# Patient Record
Sex: Female | Born: 1960 | Race: White | Hispanic: Yes | Marital: Single | Smoking: Never smoker
Health system: Southern US, Community
[De-identification: ages and names within clinical notes are randomized; demographics above are authoritative.]

## PROBLEM LIST (undated history)

## (undated) DIAGNOSIS — E7800 Pure hypercholesterolemia, unspecified: Secondary | ICD-10-CM

## (undated) DIAGNOSIS — I10 Essential (primary) hypertension: Secondary | ICD-10-CM

## (undated) DIAGNOSIS — I219 Acute myocardial infarction, unspecified: Secondary | ICD-10-CM

## (undated) HISTORY — PX: ABDOMINAL HYSTERECTOMY: SHX81

## (undated) HISTORY — PX: CARDIAC SURGERY: SHX584

---

## 2016-06-28 ENCOUNTER — Encounter (HOSPITAL_COMMUNITY): Payer: Self-pay | Admitting: Family Medicine

## 2016-06-28 ENCOUNTER — Emergency Department (HOSPITAL_COMMUNITY): Payer: Self-pay

## 2016-06-28 ENCOUNTER — Emergency Department (HOSPITAL_COMMUNITY)
Admission: EM | Admit: 2016-06-28 | Discharge: 2016-06-28 | Disposition: A | Discharge status: Home / Self Care | Payer: Self-pay | Attending: Emergency Medicine | Admitting: Emergency Medicine

## 2016-06-28 DIAGNOSIS — R42 Dizziness and giddiness: Secondary | ICD-10-CM | POA: Insufficient documentation

## 2016-06-28 DIAGNOSIS — I10 Essential (primary) hypertension: Secondary | ICD-10-CM | POA: Insufficient documentation

## 2016-06-28 HISTORY — DX: Pure hypercholesterolemia, unspecified: E78.00

## 2016-06-28 HISTORY — DX: Essential (primary) hypertension: I10

## 2016-06-28 LAB — BASIC METABOLIC PANEL
ANION GAP: 9 (ref 5–15)
BUN: 10 mg/dL (ref 6–20)
CO2: 23 mmol/L (ref 22–32)
Calcium: 8.9 mg/dL (ref 8.9–10.3)
Chloride: 107 mmol/L (ref 101–111)
Creatinine, Ser: 0.67 mg/dL (ref 0.44–1.00)
Glucose, Bld: 167 mg/dL — ABNORMAL HIGH (ref 65–99)
POTASSIUM: 3.3 mmol/L — AB (ref 3.5–5.1)
SODIUM: 139 mmol/L (ref 135–145)

## 2016-06-28 LAB — CBC WITH DIFFERENTIAL/PLATELET
BASOS ABS: 0 10*3/uL (ref 0.0–0.1)
BASOS PCT: 0 %
EOS ABS: 0 10*3/uL (ref 0.0–0.7)
EOS PCT: 0 %
HCT: 42.4 % (ref 36.0–46.0)
HEMOGLOBIN: 14.2 g/dL (ref 12.0–15.0)
Lymphocytes Relative: 18 %
Lymphs Abs: 1.8 10*3/uL (ref 0.7–4.0)
MCH: 29.6 pg (ref 26.0–34.0)
MCHC: 33.5 g/dL (ref 30.0–36.0)
MCV: 88.3 fL (ref 78.0–100.0)
Monocytes Absolute: 0.3 10*3/uL (ref 0.1–1.0)
Monocytes Relative: 4 %
NEUTROS PCT: 78 %
Neutro Abs: 7.6 10*3/uL (ref 1.7–7.7)
PLATELETS: 170 10*3/uL (ref 150–400)
RBC: 4.8 MIL/uL (ref 3.87–5.11)
RDW: 13.3 % (ref 11.5–15.5)
WBC: 9.8 10*3/uL (ref 4.0–10.5)

## 2016-06-28 MED ORDER — MECLIZINE HCL 25 MG PO TABS
25.0000 mg | ORAL_TABLET | Freq: Once | ORAL | Status: AC
  Administered 2016-06-28: 25 mg via ORAL
  Filled 2016-06-28: qty 1

## 2016-06-28 MED ORDER — DIAZEPAM 5 MG PO TABS: 5.0000 mg | ORAL_TABLET | Freq: Three times a day (TID) | ORAL | 0 refills | Status: DC | PRN

## 2016-06-28 MED ORDER — MECLIZINE HCL 25 MG PO TABS: 25.0000 mg | ORAL_TABLET | Freq: Three times a day (TID) | ORAL | 0 refills | Status: DC | PRN

## 2016-06-28 MED ORDER — METOCLOPRAMIDE HCL 5 MG/ML IJ SOLN
10.0000 mg | Freq: Once | INTRAMUSCULAR | Status: DC
  Filled 2016-06-28: qty 2

## 2016-06-28 MED ORDER — SODIUM CHLORIDE 0.9 % IV BOLUS (SEPSIS)
1000.0000 mL | Freq: Once | INTRAVENOUS | Status: AC
  Administered 2016-06-28: 1000 mL via INTRAVENOUS

## 2016-06-28 MED ORDER — MECLIZINE HCL 25 MG PO TABS
25.0000 mg | ORAL_TABLET | Freq: Once | ORAL | Status: DC
  Filled 2016-06-28: qty 1

## 2016-06-28 MED ORDER — DIAZEPAM 5 MG/ML IJ SOLN
2.5000 mg | Freq: Once | INTRAMUSCULAR | Status: AC
  Administered 2016-06-28: 2.5 mg via INTRAVENOUS
  Filled 2016-06-28: qty 2

## 2016-06-28 MED ORDER — LOSARTAN POTASSIUM-HCTZ 100-25 MG PO TABS: 1.0000 | ORAL_TABLET | Freq: Every day | ORAL | 0 refills | Status: DC

## 2016-06-28 NOTE — ED Notes (Signed)
Patient transported to MRI 

## 2016-06-28 NOTE — ED Provider Notes (Signed)
[facility] DEPT Provider Note   CSN: [id number] Arrival date & time: 06/28/16  [id number]     History   Chief Complaint Chief Complaint  Patient presents with  . Weakness  . Dizziness    HPI Crystal Lowe is a 55 y.o. female.  The history is provided by the patient and medical records. Language interpreter used: Family at bedside aiding in translation as needed.  Weakness  Primary symptoms include dizziness. Pertinent negatives include no shortness of breath, no vomiting and no headaches.  Dizziness  Associated symptoms: weakness   Associated symptoms: no headaches, no nausea, no shortness of breath and no vomiting    Crystal Lowe is a 55 y.o. female  with a PMH of HTN, HLD who presents to the Emergency Department complaining of dizziness described as the room spinning which has been occurring intermittently for the last 3 days, but much worse this morning. Dizziness is worse with lying flat and moving head left to right. No history of similar sxs. No headache. No fevers. No chest pain, shortness of breath, n/v. Took blood pressure medications and garlic this morning with no relief in symptoms.    Past Medical History:  Diagnosis Date  . High cholesterol   . Hypertension     There are no active problems to display for this patient.   History reviewed. No pertinent surgical history.  OB History    No data available       Home Medications    Prior to Admission medications   Medication Sig Start Date End Date Taking? Authorizing Provider  atorvastatin (LIPITOR) 20 MG tablet Take 20 mg by mouth daily.   Yes Historical Provider, MD  clopidogrel (PLAVIX) 75 MG tablet Take 75 mg by mouth daily.   Yes Historical Provider, MD  gabapentin (NEURONTIN) 100 MG capsule Take 100 mg by mouth daily.   Yes Historical Provider, MD  GARLIC PO Take 1 capsule by mouth daily.   Yes Historical Provider, MD  losartan-hydrochlorothiazide (HYZAAR) 100-25 MG tablet Take 1  tablet by mouth daily.   Yes Historical Provider, MD  metoprolol succinate (TOPROL-XL) 50 MG 24 hr tablet Take 100 mg by mouth daily. Take with or immediately following a meal.   Yes Historical Provider, MD  diazepam (VALIUM) 5 MG tablet Take 1 tablet (5 mg total) by mouth every 8 (eight) hours as needed for anxiety. 06/28/16   Andreal Vultaggio Pilcher Curties Conigliaro, PA-C  meclizine (ANTIVERT) 25 MG tablet Take 1 tablet (25 mg total) by mouth 3 (three) times daily as needed for dizziness. 06/28/16   Wilmore, PA-C    Family History History reviewed. No pertinent family history.  Social History Social History  Substance Use Topics  . Smoking status: Never Smoker  . Smokeless tobacco: Never Used  . Alcohol use Not on file     Allergies   Aspirin   Review of Systems Review of Systems  Constitutional: Negative for chills and fever.  HENT: Negative for ear pain.   Eyes: Negative for visual disturbance.  Respiratory: Negative for cough and shortness of breath.   Cardiovascular: Negative.   Gastrointestinal: Negative for abdominal pain, nausea and vomiting.  Genitourinary: Negative for dysuria.  Musculoskeletal: Negative for back pain.  Skin: Negative for rash.  Neurological: Positive for dizziness and weakness. Negative for light-headedness, numbness and headaches.     Physical Exam Updated Vital Signs BP 146/87 (BP Location: Left Arm)   Pulse 66   Temp 97.4 F (36.3 C) (Oral)  Resp 16   SpO2 100%   Physical Exam  Constitutional: She is oriented to person, place, and time. She appears well-developed and well-nourished. No distress.  HENT:  Head: Normocephalic and atraumatic.  Eyes:  Horizontal nystagmus   Neck: Normal range of motion.  Cardiovascular: Normal rate, regular rhythm and normal heart sounds.   No murmur heard. Pulmonary/Chest: Effort normal and breath sounds normal. No respiratory distress. She has no wheezes. She has no rales.  Abdominal: Soft. She exhibits no  distension. There is no tenderness.  Musculoskeletal: She exhibits no edema.  5/5 muscle strength in all four extremities including grip strength.   Neurological: She is alert and oriented to person, place, and time.  Alert, oriented, thought content appropriate, able to give a coherent history. Speech is clear and goal oriented, able to follow commands.  Cranial Nerves:  II:  Peripheral visual fields grossly normal, pupils equal, round, reactive to light III, IV, VI: EOM intact bilaterally, ptosis not present V,VII: smile symmetric, eyes kept closed tightly against resistance, facial light touch sensation equal VIII: hearing grossly normal IX, X: symmetric soft palate movement, uvula elevates symmetrically  XI: bilateral shoulder shrug symmetric and strong XII: midline tongue extension Sensory to light touch normal in all four extremities.   Skin: Skin is warm and dry.  Nursing note and vitals reviewed.    ED Treatments / Results  Labs (all labs ordered are listed, but only abnormal results are displayed) Labs Reviewed  BASIC METABOLIC PANEL - Abnormal; Notable for the following:       Result Value   Potassium 3.3 (*)    Glucose, Bld 167 (*)    All other components within normal limits  CBC WITH DIFFERENTIAL/PLATELET    EKG  EKG Interpretation None       Radiology Mr Brain Wo Contrast  Result Date: 06/28/2016 CLINICAL DATA:  Weakness and dizziness for a few days. Recent travel outside the country to [place]. EXAM: MRI HEAD WITHOUT CONTRAST TECHNIQUE: Multiplanar, multiecho pulse sequences of the brain and surrounding structures were obtained without intravenous contrast. COMPARISON:  None. FINDINGS: Brain: No evidence for acute infarction, hemorrhage, mass lesion, hydrocephalus, or extra-axial fluid. Normal cerebral volume. No significant white matter disease. Vascular: Flow voids are maintained throughout the carotid, basilar, and vertebral arteries. There are no areas  of chronic hemorrhage. Skull and upper cervical spine: Empty sella with expansion. Upper cervical region unremarkable. No tonsillar herniation. Normal marrow signal. Sinuses/Orbits: Negative. Other: Prominent cervical lymph nodes, incompletely evaluated. IMPRESSION: No acute intracranial findings. Empty sella, which can be a cause of headaches and other nonspecific neurologic complaints. Relationship of this finding to the patient's dizziness is uncertain. Electronically Signed   By: Staci Righter M.D.   On: 06/28/2016 14:36    Procedures Procedures (including critical care time)  Medications Ordered in ED Medications  sodium chloride 0.9 % bolus 1,000 mL (1,000 mLs Intravenous New Bag/Given 06/28/16 1021)  meclizine (ANTIVERT) tablet 25 mg (25 mg Oral Given 06/28/16 1021)  diazepam (VALIUM) injection 2.5 mg (2.5 mg Intravenous Given 06/28/16 1203)  diazepam (VALIUM) injection 2.5 mg (2.5 mg Intravenous Given 06/28/16 1328)     Initial Impression / Assessment and Plan / ED Course  I have reviewed the triage vital signs and the nursing notes.  Pertinent labs & imaging results that were available during my care of the patient were reviewed by me and considered in my medical decision making (see chart for details).  Clinical Course  Crystal Lowe is a 55 y.o. female who presents to ED for intermittent dizziness for the last 3 days. No headache. On exam, patient is afebrile, EOMI but with horizontal nystagmus present. Dizziness is reproducible with rotation of the head and lying flat. Appears c/w peripheral vertigo. Will treat with meclizine, fluids and obtain labs then reassess.   11:21 AM - Patient re-evaluated. Dizziness improved, but not resolved. She would like to use the restroom. I assisted patient up to evaluate gait to restroom, but unfortunately she became very dizzy. Wheelchair used to assist patient to restroom.   12:30 PM - Patient re-evaluated after valium. Did have some  improvement, but still very dizzy with slight movement of the head. Will obtain MR for further evaluation.    MR with no acute intracranial findings. Patient re-evaluated and feels much improved. Still complaining of some dizziness but feels like she will be okay going home. Discussed admission vs. Home and patient would like to go home today. Will give rx for antivert and valium for symptoms. Follow up with neurology if symptoms not improved on Monday. Reasons to return to the ER were discussed and all questions answered.   Patient seen by and discussed with Dr. Johnney Killian who agrees with treatment plan.    Final Clinical Impressions(s) / ED Diagnoses   Final diagnoses:  Vertigo    New Prescriptions New Prescriptions   DIAZEPAM (VALIUM) 5 MG TABLET    Take 1 tablet (5 mg total) by mouth every 8 (eight) hours as needed for anxiety.   MECLIZINE (ANTIVERT) 25 MG TABLET    Take 1 tablet (25 mg total) by mouth 3 (three) times daily as needed for dizziness.     [facility] Harel Repetto, PA-C 06/28/16 1511    Charlesetta Shanks, MD 06/29/16 [phone]

## 2016-06-28 NOTE — ED Triage Notes (Addendum)
Pt here for weakness and dizziness x a few days. hasn't had her meds in a few days for BP. Recent travel from [place]. Pt on HCTZ and metoprolol. Pt took 2 of a BP med this am unsure of which one. Stroke scale negative. No headaches. 20 RAC and Zofran given with improvement. BP was 147/80, pulse 60

## 2016-06-28 NOTE — Discharge Instructions (Signed)
Take meclizine and valium as needed for dizziness. Stay hydrated.  Please call the neurology clinic on Monday morning to schedule a follow up appointment if symptoms are not completely resolved. Please return to the ER for uncontrollable vomiting, new or worsening symptoms, any additional concerns.

## 2016-08-07 ENCOUNTER — Emergency Department (HOSPITAL_COMMUNITY)
Admission: EM | Admit: 2016-08-07 | Discharge: 2016-08-07 | Disposition: A | Discharge status: Home / Self Care | Payer: Self-pay | Attending: Emergency Medicine | Admitting: Emergency Medicine

## 2016-08-07 ENCOUNTER — Encounter (HOSPITAL_COMMUNITY): Payer: Self-pay

## 2016-08-07 DIAGNOSIS — Z7902 Long term (current) use of antithrombotics/antiplatelets: Secondary | ICD-10-CM | POA: Insufficient documentation

## 2016-08-07 DIAGNOSIS — Z79899 Other long term (current) drug therapy: Secondary | ICD-10-CM | POA: Insufficient documentation

## 2016-08-07 DIAGNOSIS — H8112 Benign paroxysmal vertigo, left ear: Secondary | ICD-10-CM | POA: Insufficient documentation

## 2016-08-07 DIAGNOSIS — I10 Essential (primary) hypertension: Secondary | ICD-10-CM | POA: Insufficient documentation

## 2016-08-07 DIAGNOSIS — I252 Old myocardial infarction: Secondary | ICD-10-CM | POA: Insufficient documentation

## 2016-08-07 HISTORY — DX: Acute myocardial infarction, unspecified: I21.9

## 2016-08-07 LAB — BASIC METABOLIC PANEL
ANION GAP: 10 (ref 5–15)
BUN: 18 mg/dL (ref 6–20)
CALCIUM: 9.7 mg/dL (ref 8.9–10.3)
CO2: 28 mmol/L (ref 22–32)
CREATININE: 0.79 mg/dL (ref 0.44–1.00)
Chloride: 101 mmol/L (ref 101–111)
GFR calc Af Amer: >60 mL/min (ref >60)
GFR calc non Af Amer: >60 mL/min (ref >60)
GLUCOSE: 112 mg/dL — AB (ref 65–99)
Potassium: 3.3 mmol/L — ABNORMAL LOW (ref 3.5–5.1)
Sodium: 139 mmol/L (ref 135–145)

## 2016-08-07 LAB — CBC
HCT: 42.8 % (ref 36.0–46.0)
HEMOGLOBIN: 14.5 g/dL (ref 12.0–15.0)
MCH: 29.7 pg (ref 26.0–34.0)
MCHC: 33.9 g/dL (ref 30.0–36.0)
MCV: 87.5 fL (ref 78.0–100.0)
Platelets: 204 10*3/uL (ref 150–400)
RBC: 4.89 MIL/uL (ref 3.87–5.11)
RDW: 13.4 % (ref 11.5–15.5)
WBC: 9.8 10*3/uL (ref 4.0–10.5)

## 2016-08-07 LAB — URINALYSIS, ROUTINE W REFLEX MICROSCOPIC
Bacteria, UA: NONE SEEN
Bilirubin Urine: NEGATIVE
GLUCOSE, UA: NEGATIVE mg/dL
Hgb urine dipstick: NEGATIVE
Ketones, ur: NEGATIVE mg/dL
Nitrite: NEGATIVE
PROTEIN: NEGATIVE mg/dL
Specific Gravity, Urine: 1.016 (ref 1.005–1.030)
pH: 6 (ref 5.0–8.0)

## 2016-08-07 LAB — CBG MONITORING, ED: Glucose-Capillary: 97 mg/dL (ref 65–99)

## 2016-08-07 MED ORDER — MECLIZINE HCL 25 MG PO TABS: 25.0000 mg | ORAL_TABLET | Freq: Three times a day (TID) | ORAL | 0 refills | Status: DC | PRN

## 2016-08-07 NOTE — ED Provider Notes (Signed)
[facility] DEPT Provider Note   CSN: [id number] Arrival date & time: 08/07/16  1400     History   Chief Complaint Chief Complaint  Patient presents with  . Hypertension  . Nausea  . Near Syncope    HPI Crystal Lowe is a 56 y.o. female.  The history is provided by the patient.  Dizziness  Quality:  Room spinning Severity:  Moderate Onset quality:  Sudden Duration:  6 hours Timing:  Constant Progression:  Unchanged Chronicity:  Recurrent Context: head movement   Context: not with loss of consciousness   Relieved by:  Nothing Worsened by:  Nothing Ineffective treatments: single dose of meclizine at home. Associated symptoms: vomiting (once)   Associated symptoms: no shortness of breath and no tinnitus   Risk factors: hx of vertigo     Past Medical History:  Diagnosis Date  . High cholesterol   . Hypertension   . MI (myocardial infarction)     There are no active problems to display for this patient.   Past Surgical History:  Procedure Laterality Date  . ABDOMINAL HYSTERECTOMY    . CARDIAC SURGERY      OB History    No data available       Home Medications    Prior to Admission medications   Medication Sig Start Date End Date Taking? Authorizing Provider  atorvastatin (LIPITOR) 20 MG tablet Take 20 mg by mouth daily.    Historical Provider, MD  clopidogrel (PLAVIX) 75 MG tablet Take 75 mg by mouth daily.    Historical Provider, MD  diazepam (VALIUM) 5 MG tablet Take 1 tablet (5 mg total) by mouth every 8 (eight) hours as needed for anxiety. 06/28/16   Ozella Almond Ward, PA-C  gabapentin (NEURONTIN) 100 MG capsule Take 100 mg by mouth daily.    Historical Provider, MD  GARLIC PO Take 1 capsule by mouth daily.    Historical Provider, MD  losartan-hydrochlorothiazide (HYZAAR) 100-25 MG tablet Take 1 tablet by mouth daily. 06/28/16   Jaime Pilcher Ward, PA-C  meclizine (ANTIVERT) 25 MG tablet Take 1 tablet (25 mg total) by mouth 3 (three)  times daily as needed for dizziness. 06/28/16   Ozella Almond Ward, PA-C  metoprolol succinate (TOPROL-XL) 50 MG 24 hr tablet Take 100 mg by mouth daily. Take with or immediately following a meal.    Historical Provider, MD    Family History Family History  Problem Relation Age of Onset  . Hypertension Mother   . Hypertension Father   . Diabetes Father   . Asthma Father   . Kidney failure Father     Social History Social History  Substance Use Topics  . Smoking status: Never Smoker  . Smokeless tobacco: Never Used  . Alcohol use No     Allergies   Aspirin   Review of Systems Review of Systems  HENT: Negative for tinnitus.   Respiratory: Negative for shortness of breath.   Gastrointestinal: Positive for vomiting (once).  Neurological: Positive for dizziness.  All other systems reviewed and are negative.    Physical Exam Updated Vital Signs BP 135/86 (BP Location: Left Arm)   Pulse 66   Temp 98 F (36.7 C) (Oral)   Resp 20   Ht 5\' 6"  (1.676 m)   Wt 231 lb (104.8 kg)   SpO2 100%   BMI 37.28 kg/m   Physical Exam  Constitutional: She is oriented to person, place, and time. She appears well-developed and well-nourished. No distress.  HENT:  Head: Normocephalic.  Nose: Nose normal.  Eyes: Conjunctivae are normal.  Neck: Neck supple. No tracheal deviation present.  Cardiovascular: Normal rate and regular rhythm.   Pulmonary/Chest: Effort normal. No respiratory distress.  Abdominal: Soft. She exhibits no distension.  Neurological: She is alert and oriented to person, place, and time. She has normal strength. No cranial nerve deficit or sensory deficit. GCS eye subscore is 4. GCS verbal subscore is 5. GCS motor subscore is 6.  Normal finger to nose testing and rapid alternating movement. Positive left head impulse with nystagmus present. Ambulatory unassisted with normal gait.  Skin: Skin is warm and dry.  Psychiatric: She has a normal mood and affect.     ED  Treatments / Results  Labs (all labs ordered are listed, but only abnormal results are displayed) Labs Reviewed  BASIC METABOLIC PANEL - Abnormal; Notable for the following:       Result Value   Potassium 3.3 (*)    Glucose, Bld 112 (*)    All other components within normal limits  URINALYSIS, ROUTINE W REFLEX MICROSCOPIC - Abnormal; Notable for the following:    Leukocytes, UA TRACE (*)    Squamous Epithelial / LPF 0-5 (*)    All other components within normal limits  CBC  CBG MONITORING, ED    EKG  EKG Interpretation None       Radiology No results found.  Procedures Procedures (including critical care time)  Medications Ordered in ED Medications - No data to display   Initial Impression / Assessment and Plan / ED Course  I have reviewed the triage vital signs and the nursing notes.  Pertinent labs & imaging results that were available during my care of the patient were reviewed by me and considered in my medical decision making (see chart for details).  Clinical Course     56 y.o. female presents with dizziness starting today. She is out of her meclizine prescription as of this morning. She has a positive left impulse test and convincing peripheral vertigo. No central component on exam. Complaint of hypertension at home that has resolved since taking home medicines. Refilled meclizine prescription for symptomatic treatment. Plan to follow up with PCP as needed and return precautions discussed for worsening or new concerning symptoms.   Final Clinical Impressions(s) / ED Diagnoses   Final diagnoses:  BPPV (benign paroxysmal positional vertigo), left    New Prescriptions Discharge Medication List as of 08/07/2016  8:22 PM       Leo Grosser, MD 08/08/16 [id number]

## 2016-08-07 NOTE — ED Triage Notes (Signed)
Patient states she had vomiting and dizziness and her  BP was 145/100.

## 2018-01-25 ENCOUNTER — Telehealth: Payer: Self-pay | Admitting: *Deleted

## 2018-01-25 NOTE — Telephone Encounter (Signed)
REFERRAL SENT TO [facility] FROM [place], NP [phone].

## 2018-01-27 ENCOUNTER — Observation Stay (HOSPITAL_COMMUNITY)
Admission: EM | Admit: 2018-01-27 | Discharge: 2018-01-29 | Disposition: A | Discharge status: Home / Self Care | Payer: Medicaid Other | Attending: Family Medicine | Admitting: Family Medicine

## 2018-01-27 ENCOUNTER — Encounter (HOSPITAL_COMMUNITY): Payer: Self-pay | Admitting: *Deleted

## 2018-01-27 ENCOUNTER — Other Ambulatory Visit: Payer: Self-pay

## 2018-01-27 ENCOUNTER — Emergency Department (HOSPITAL_COMMUNITY): Payer: Medicaid Other

## 2018-01-27 DIAGNOSIS — I252 Old myocardial infarction: Secondary | ICD-10-CM | POA: Insufficient documentation

## 2018-01-27 DIAGNOSIS — I110 Hypertensive heart disease with heart failure: Secondary | ICD-10-CM | POA: Diagnosis not present

## 2018-01-27 DIAGNOSIS — I420 Dilated cardiomyopathy: Secondary | ICD-10-CM | POA: Diagnosis not present

## 2018-01-27 DIAGNOSIS — Z79899 Other long term (current) drug therapy: Secondary | ICD-10-CM | POA: Diagnosis not present

## 2018-01-27 DIAGNOSIS — E876 Hypokalemia: Secondary | ICD-10-CM

## 2018-01-27 DIAGNOSIS — R0789 Other chest pain: Principal | ICD-10-CM | POA: Insufficient documentation

## 2018-01-27 DIAGNOSIS — I2510 Atherosclerotic heart disease of native coronary artery without angina pectoris: Secondary | ICD-10-CM | POA: Diagnosis not present

## 2018-01-27 DIAGNOSIS — Z9889 Other specified postprocedural states: Secondary | ICD-10-CM | POA: Diagnosis not present

## 2018-01-27 DIAGNOSIS — E875 Hyperkalemia: Secondary | ICD-10-CM | POA: Diagnosis not present

## 2018-01-27 DIAGNOSIS — R079 Chest pain, unspecified: Secondary | ICD-10-CM

## 2018-01-27 DIAGNOSIS — E7800 Pure hypercholesterolemia, unspecified: Secondary | ICD-10-CM | POA: Diagnosis not present

## 2018-01-27 DIAGNOSIS — R112 Nausea with vomiting, unspecified: Secondary | ICD-10-CM | POA: Insufficient documentation

## 2018-01-27 DIAGNOSIS — Z886 Allergy status to analgesic agent status: Secondary | ICD-10-CM | POA: Insufficient documentation

## 2018-01-27 DIAGNOSIS — Z90710 Acquired absence of both cervix and uterus: Secondary | ICD-10-CM | POA: Insufficient documentation

## 2018-01-27 DIAGNOSIS — I5030 Unspecified diastolic (congestive) heart failure: Secondary | ICD-10-CM | POA: Insufficient documentation

## 2018-01-27 DIAGNOSIS — Z8249 Family history of ischemic heart disease and other diseases of the circulatory system: Secondary | ICD-10-CM | POA: Insufficient documentation

## 2018-01-27 DIAGNOSIS — I10 Essential (primary) hypertension: Secondary | ICD-10-CM | POA: Diagnosis present

## 2018-01-27 LAB — CBC
HCT: 44.6 % (ref 36.0–46.0)
Hemoglobin: 14.7 g/dL (ref 12.0–15.0)
MCH: 29.5 pg (ref 26.0–34.0)
MCHC: 33 g/dL (ref 30.0–36.0)
MCV: 89.6 fL (ref 78.0–100.0)
PLATELETS: 207 10*3/uL (ref 150–400)
RBC: 4.98 MIL/uL (ref 3.87–5.11)
RDW: 12.8 % (ref 11.5–15.5)
WBC: 10.9 10*3/uL — ABNORMAL HIGH (ref 4.0–10.5)

## 2018-01-27 LAB — BASIC METABOLIC PANEL
Anion gap: 12 (ref 5–15)
BUN: 16 mg/dL (ref 6–20)
CHLORIDE: 98 mmol/L (ref 98–111)
CO2: 25 mmol/L (ref 22–32)
CREATININE: 0.87 mg/dL (ref 0.44–1.00)
Calcium: 9.4 mg/dL (ref 8.9–10.3)
GFR calc Af Amer: >60 mL/min (ref >60)
GFR calc non Af Amer: >60 mL/min (ref >60)
Glucose, Bld: 141 mg/dL — ABNORMAL HIGH (ref 70–99)
Potassium: 2.4 mmol/L — CL (ref 3.5–5.1)
SODIUM: 135 mmol/L (ref 135–145)

## 2018-01-27 LAB — I-STAT TROPONIN, ED: Troponin i, poc: 0 ng/mL (ref 0.00–0.08)

## 2018-01-27 MED ORDER — POTASSIUM CHLORIDE CRYS ER 20 MEQ PO TBCR
40.0000 meq | EXTENDED_RELEASE_TABLET | Freq: Once | ORAL | Status: AC
  Administered 2018-01-27: 40 meq via ORAL
  Filled 2018-01-27: qty 2

## 2018-01-27 MED ORDER — POTASSIUM CHLORIDE 10 MEQ/100ML IV SOLN
10.0000 meq | Freq: Once | INTRAVENOUS | Status: AC
  Administered 2018-01-27: 10 meq via INTRAVENOUS
  Filled 2018-01-27: qty 100

## 2018-01-27 MED ORDER — MAGNESIUM SULFATE 2 GM/50ML IV SOLN
2.0000 g | Freq: Once | INTRAVENOUS | Status: AC
  Administered 2018-01-28: 2 g via INTRAVENOUS
  Filled 2018-01-27: qty 50

## 2018-01-27 MED ORDER — FENTANYL CITRATE (PF) 100 MCG/2ML IJ SOLN
50.0000 ug | Freq: Once | INTRAMUSCULAR | Status: AC
  Administered 2018-01-27: 50 ug via INTRAVENOUS
  Filled 2018-01-27: qty 2

## 2018-01-27 NOTE — ED Notes (Signed)
Pt reports having chest pressure and dizziness when she was bending over looking into her oven. Pt reports a hx of MI in the past.

## 2018-01-27 NOTE — ED Triage Notes (Signed)
Pt arrived by EMS for chest pain and dizziness. Pt was looking inside her oven, stood up, started feeling chest pressure and dizziness with NV x 4. Pt has allergy to ASA. Pt bp 106/64, no nitro was given. [id number]. Hx of MI several years ago

## 2018-01-27 NOTE — ED Provider Notes (Signed)
[facility] EMERGENCY DEPARTMENT Provider Note   CSN: [id number] Arrival date & time: 01/27/18  2157     History   Chief Complaint Chief Complaint  Patient presents with  . Chest Pain  . Dizziness    HPI Crystal Lowe is a 57 y.o. female.  The history is provided by the patient and a relative.  Chest Pain   This is a new problem. The current episode started 3 to 5 hours ago. The problem occurs constantly. The problem has not changed since onset.The pain is present in the substernal region. The pain is moderate. The quality of the pain is described as pressure-like ("Trapped air "). The pain does not radiate. Associated symptoms include diaphoresis, dizziness and vomiting. Pertinent negatives include no abdominal pain, no fever, no shortness of breath and no syncope. She has tried nothing for the symptoms.  Her past medical history is significant for CAD.  Patient with history of hyperlipidemia, hypertension, previous MI presents with chest pain.  She reports she was bending over to open up an oven and she started having chest pressure and felt like air was trapped in her chest.  She also reports dizziness.  She reports she had diaphoresis, vomiting, 2 episodes of diarrhea  She reports she had an MI several years ago while living in [place]  Past Medical History:  Diagnosis Date  . High cholesterol   . Hypertension   . MI (myocardial infarction) ([place])     There are no active problems to display for this patient.   Past Surgical History:  Procedure Laterality Date  . ABDOMINAL HYSTERECTOMY    . CARDIAC SURGERY       OB History   None      Home Medications    Prior to Admission medications   Medication Sig Start Date End Date Taking? Authorizing Provider  atorvastatin (LIPITOR) 20 MG tablet Take 20 mg by mouth daily.    [provider]  clopidogrel (PLAVIX) 75 MG tablet Take 75 mg by mouth daily.    [provider]    diazepam (VALIUM) 5 MG tablet Take 1 tablet (5 mg total) by mouth every 8 (eight) hours as needed for anxiety. 06/28/16   Ward, Ozella Almond, PA-C  gabapentin (NEURONTIN) 100 MG capsule Take 100 mg by mouth daily.    [provider]  GARLIC PO Take 1 capsule by mouth daily.    [provider]  losartan-hydrochlorothiazide (HYZAAR) 100-25 MG tablet Take 1 tablet by mouth daily. 06/28/16   Ward, Ozella Almond, PA-C  meclizine (ANTIVERT) 25 MG tablet Take 1 tablet (25 mg total) by mouth 3 (three) times daily as needed for dizziness. 08/07/16   Leo Grosser, MD  metoprolol succinate (TOPROL-XL) 50 MG 24 hr tablet Take 100 mg by mouth daily. Take with or immediately following a meal.    [provider]    Family History Family History  Problem Relation Age of Onset  . Hypertension Mother   . Hypertension Father   . Diabetes Father   . Asthma Father   . Kidney failure Father     Social History Social History   Tobacco Use  . Smoking status: Never Smoker  . Smokeless tobacco: Never Used  Substance Use Topics  . Alcohol use: No  . Drug use: No     Allergies   Aspirin   Review of Systems Review of Systems  Constitutional: Positive for diaphoresis. Negative for fever.  Respiratory: Negative  for shortness of breath.   Cardiovascular: Positive for chest pain. Negative for syncope.  Gastrointestinal: Positive for diarrhea and vomiting. Negative for abdominal pain.  Neurological: Positive for dizziness.  All other systems reviewed and are negative.    Physical Exam Updated Vital Signs BP 117/72   Pulse 62   Temp 97.9 F (36.6 C)   Resp 12   SpO2 97%   Physical Exam CONSTITUTIONAL: Well developed/well nourished HEAD: Normocephalic/atraumatic EYES: EOMI/PERRL ENMT: Mucous membranes moist NECK: supple no meningeal signs SPINE/BACK:entire spine nontender CV: S1/S2 noted, no murmurs/rubs/gallops noted Chest-mild left-sided chest  tenderness LUNGS: Lungs are clear to auscultation bilaterally, no apparent distress ABDOMEN: soft, nontender, no rebound or guarding, bowel sounds noted throughout abdomen GU:no cva tenderness NEURO: Pt is awake/alert/appropriate, moves all extremitiesx4.  No facial droop. no Focal weakness noted EXTREMITIES: pulses normal/equal, full ROM SKIN: warm, color normal PSYCH: no abnormalities of mood noted, alert and oriented to situation   ED Treatments / Results  Labs (all labs ordered are listed, but only abnormal results are displayed) Labs Reviewed  BASIC METABOLIC PANEL - Abnormal; Notable for the following components:      Result Value   Potassium 2.4 (*)    Glucose, Bld 141 (*)    All other components within normal limits  CBC - Abnormal; Notable for the following components:   WBC 10.9 (*)    All other components within normal limits  I-STAT TROPONIN, ED    EKG EKG Interpretation  Date/Time:  Wednesday January 27 2018 22:02:47 EDT Ventricular Rate:  64 PR Interval:    QRS Duration: 95 QT Interval:  447 QTC Calculation: 462 R Axis:   36 Text Interpretation:  Sinus rhythm Confirmed by Ripley Fraise [phone]) on 01/27/2018 11:10:04 PM   Radiology Dg Chest 2 View  Result Date: 01/27/2018 CLINICAL DATA:  Chest pain and dizziness.  Nausea and vomiting. EXAM: CHEST - 2 VIEW COMPARISON:  None. FINDINGS: The heart size and mediastinal contours are within normal limits. Both lungs are clear. The visualized skeletal structures are unremarkable. IMPRESSION: No active cardiopulmonary disease. Electronically Signed   By: Lucienne Capers M.D.   On: 01/27/2018 23:07    Procedures Procedures   Medications Ordered in ED Medications  fentaNYL (SUBLIMAZE) injection 50 mcg (has no administration in time range)  potassium chloride 10 mEq in 100 mL IVPB (has no administration in time range)  magnesium sulfate IVPB 2 g 50 mL (has no administration in time range)  potassium chloride SA  (K-DUR,KLOR-CON) CR tablet 40 mEq (40 mEq Oral Given 01/27/18 2348)     Initial Impression / Assessment and Plan / ED Course  I have reviewed the triage vital signs and the nursing notes.  Pertinent labs & imaging results that were available during my care of the patient were reviewed by me and considered in my medical decision making (see chart for details).     11:56 PM Patient with known history of CAD presents with chest pain.  No recent work-up noted in our system.  Will admit for chest pain work-up.  She is also noted to have significant hypokalemia, likely due to medications that she is on hydrochlorthiazide. 1:13 AM D/w Dr. Hal Hope for admission.  Patient is feeling improved.  Final Clinical Impressions(s) / ED Diagnoses   Final diagnoses:  Chest pain, rule out acute myocardial infarction  Hypokalemia    ED Discharge Orders    None       Ripley Fraise, MD 01/28/18 [phone]

## 2018-01-28 ENCOUNTER — Encounter (HOSPITAL_COMMUNITY): Payer: Self-pay | Admitting: Internal Medicine

## 2018-01-28 ENCOUNTER — Observation Stay (HOSPITAL_BASED_OUTPATIENT_CLINIC_OR_DEPARTMENT_OTHER): Payer: Medicaid Other

## 2018-01-28 ENCOUNTER — Other Ambulatory Visit: Payer: Self-pay

## 2018-01-28 DIAGNOSIS — E785 Hyperlipidemia, unspecified: Secondary | ICD-10-CM | POA: Diagnosis not present

## 2018-01-28 DIAGNOSIS — E876 Hypokalemia: Secondary | ICD-10-CM | POA: Diagnosis not present

## 2018-01-28 DIAGNOSIS — R0789 Other chest pain: Secondary | ICD-10-CM | POA: Diagnosis not present

## 2018-01-28 DIAGNOSIS — R079 Chest pain, unspecified: Secondary | ICD-10-CM | POA: Diagnosis not present

## 2018-01-28 DIAGNOSIS — I10 Essential (primary) hypertension: Secondary | ICD-10-CM | POA: Diagnosis not present

## 2018-01-28 DIAGNOSIS — I361 Nonrheumatic tricuspid (valve) insufficiency: Secondary | ICD-10-CM

## 2018-01-28 LAB — HEPATIC FUNCTION PANEL
ALT: 29 U/L (ref 0–44)
AST: 29 U/L (ref 15–41)
Albumin: 3.8 g/dL (ref 3.5–5.0)
Alkaline Phosphatase: 47 U/L (ref 38–126)
BILIRUBIN DIRECT: 0.1 mg/dL (ref 0.0–0.2)
BILIRUBIN INDIRECT: 0.7 mg/dL (ref 0.3–0.9)
BILIRUBIN TOTAL: 0.8 mg/dL (ref 0.3–1.2)
Total Protein: 7.2 g/dL (ref 6.5–8.1)

## 2018-01-28 LAB — ECHOCARDIOGRAM COMPLETE
HEIGHTINCHES: 66 in
WEIGHTICAEL: 3756.8 [oz_av]

## 2018-01-28 LAB — POTASSIUM: Potassium: 3.3 mmol/L — ABNORMAL LOW (ref 3.5–5.1)

## 2018-01-28 LAB — LIPID PANEL
Cholesterol: 186 mg/dL (ref 0–200)
HDL: 38 mg/dL — ABNORMAL LOW (ref >40)
LDL CALC: 98 mg/dL (ref 0–99)
Total CHOL/HDL Ratio: 4.9 RATIO
Triglycerides: 251 mg/dL — ABNORMAL HIGH (ref <150)
VLDL: 50 mg/dL — ABNORMAL HIGH (ref 0–40)

## 2018-01-28 LAB — TROPONIN I
Troponin I: <0.03 ng/mL (ref <0.03)
Troponin I: <0.03 ng/mL (ref <0.03)

## 2018-01-28 LAB — MAGNESIUM: MAGNESIUM: 2 mg/dL (ref 1.7–2.4)

## 2018-01-28 MED ORDER — ACETAMINOPHEN 325 MG PO TABS: 650.0000 mg | ORAL_TABLET | ORAL | Status: DC | PRN

## 2018-01-28 MED ORDER — FAMOTIDINE 20 MG PO TABS
20.0000 mg | ORAL_TABLET | Freq: Two times a day (BID) | ORAL | Status: DC
  Administered 2018-01-28 – 2018-01-29 (×3): 20 mg via ORAL
  Filled 2018-01-28 (×3): qty 1

## 2018-01-28 MED ORDER — AMLODIPINE BESYLATE 5 MG PO TABS: 5.0000 mg | ORAL_TABLET | Freq: Every day | ORAL | Status: DC | PRN

## 2018-01-28 MED ORDER — CLOPIDOGREL BISULFATE 75 MG PO TABS
75.0000 mg | ORAL_TABLET | Freq: Every day | ORAL | Status: DC
  Administered 2018-01-28 – 2018-01-29 (×2): 75 mg via ORAL
  Filled 2018-01-28 (×2): qty 1

## 2018-01-28 MED ORDER — MECLIZINE HCL 25 MG PO TABS: 25.0000 mg | ORAL_TABLET | Freq: Three times a day (TID) | ORAL | Status: DC | PRN

## 2018-01-28 MED ORDER — ONDANSETRON HCL 4 MG/2ML IJ SOLN: 4.0000 mg | Freq: Four times a day (QID) | INTRAMUSCULAR | Status: DC | PRN

## 2018-01-28 MED ORDER — POTASSIUM CHLORIDE CRYS ER 20 MEQ PO TBCR
40.0000 meq | EXTENDED_RELEASE_TABLET | Freq: Once | ORAL | Status: AC
  Administered 2018-01-28: 40 meq via ORAL
  Filled 2018-01-28: qty 2

## 2018-01-28 MED ORDER — GABAPENTIN 400 MG PO CAPS
400.0000 mg | ORAL_CAPSULE | Freq: Every day | ORAL | Status: DC
  Administered 2018-01-28: 400 mg via ORAL
  Filled 2018-01-28: qty 1

## 2018-01-28 MED ORDER — DIAZEPAM 5 MG PO TABS: 5.0000 mg | ORAL_TABLET | Freq: Three times a day (TID) | ORAL | Status: DC | PRN

## 2018-01-28 MED ORDER — TRAZODONE HCL 50 MG PO TABS
50.0000 mg | ORAL_TABLET | Freq: Every day | ORAL | Status: DC
  Administered 2018-01-28: 50 mg via ORAL
  Filled 2018-01-28: qty 1

## 2018-01-28 MED ORDER — METOPROLOL SUCCINATE ER 100 MG PO TB24
100.0000 mg | ORAL_TABLET | Freq: Every day | ORAL | Status: DC
  Administered 2018-01-28 – 2018-01-29 (×2): 100 mg via ORAL
  Filled 2018-01-28 (×2): qty 1

## 2018-01-28 MED ORDER — ENOXAPARIN SODIUM 40 MG/0.4ML ~~LOC~~ SOLN
40.0000 mg | SUBCUTANEOUS | Status: DC
  Administered 2018-01-28 – 2018-01-29 (×2): 40 mg via SUBCUTANEOUS
  Filled 2018-01-28 (×2): qty 0.4

## 2018-01-28 MED ORDER — ALUM & MAG HYDROXIDE-SIMETH 200-200-20 MG/5ML PO SUSP
30.0000 mL | Freq: Four times a day (QID) | ORAL | Status: DC | PRN
  Administered 2018-01-28: 30 mL via ORAL
  Filled 2018-01-28: qty 30

## 2018-01-28 MED ORDER — LOSARTAN POTASSIUM 50 MG PO TABS
100.0000 mg | ORAL_TABLET | Freq: Every day | ORAL | Status: DC
Start: 2018-01-28 — End: 2018-01-29
  Administered 2018-01-28 – 2018-01-29 (×2): 100 mg via ORAL
  Filled 2018-01-28 (×2): qty 2

## 2018-01-28 NOTE — Care Management Note (Signed)
Case Management Note  Patient Details  Name: Anuhea Shayma Pfefferle MRN: [id number] Date of Birth: [birth year]  Subjective/Objective:     Chest Pain             Action/Plan: Patient lives at home with her family; goes to the [facility] in [place] ( tele # [phone]); has private insurance with Medicaid with prescription drug coverage; pharmacy of choice is [facility]. CM will continue to follow for progression of care.  Expected Discharge Date:     Possibly 01/31/2018             Expected Discharge Plan:  Home/Self Care  Discharge planning Services  CM Consult  Status of Service:  In process, will continue to follow  Sherrilyn Rist [phone] 01/28/2018, 11:55 AM

## 2018-01-28 NOTE — Progress Notes (Signed)
Paged NP that family brought in records from [place] that Dr Harl Bowie had requested, family at bedside

## 2018-01-28 NOTE — Consult Note (Addendum)
Cardiology Consultation:   Patient ID: Crystal Lowe; [id number]; [birth year]   Admit date: 01/27/2018 Date of Consult: 01/28/2018  Primary Care Provider: Patient, No Pcp Per Primary Cardiologist: New-Dr. Carlyle Dolly, MD   Patient Profile:   Crystal Lowe is a 57 y.o. female with a hx of hypertension, hyperlipidemia and CAD (recently moved from [place] 3 months ago) who is being seen today for the evaluation of chest pain and dizziness at the request of Dr. Hal Hope.  History of Present Illness:   [name] is a 57 year old female with a history stated above who presented to [facility] on 01/27/2018 with complaints of left sided chest pain which began approximately 6 PM on day of admission. Pt states that she bent over to pick up something out of the oven and began feeling overwhelmingly dizzy and started vomiting. After the fourth episode, she began having non-radiating substernal chest pressure while at rest with associated diaphoresis and vomiting. She reports having this pressure intermittently over the course of several years which began approximately two years after her cath. While back in [place], she saw her Cardiologist who placed her on an event monitor at some point with unknown results. The plan was to re-cath her for further evaluation, however she ended up moving to the [place] and it was never completed. She brought all of her records with her and her son is to bring them to the hospital. Given her hx and symptoms, she proceeded to the ED for further evaluation. She denies recent illness, SOB, cough, orthopnea, LE swelling or syncope. She sates that she will have intermittent palpitations at times. She denies tobacco, alcohol or illicit drug use. She reports that approximately 5 years ago while living in [place], she had a cardiac cath with results that she is unsure of. She reports compliance with medications. She has a family hx of CAD in her mother and  brother.   In the ED, her chest pain improved with Fentanyl. Her K+ was found to be low at 2.4. Troponin was negative at <0.03. Creatinine was stable at 0.87. CXR with no cardiopulmonary disease.   Cardiology was asked to consult given her symptoms and to r/o ACS   Past Medical History:  Diagnosis Date  . High cholesterol   . Hypertension   . MI (myocardial infarction) [facility])     Past Surgical History:  Procedure Laterality Date  . ABDOMINAL HYSTERECTOMY    . CARDIAC SURGERY       Prior to Admission medications   Medication Sig Start Date End Date Taking? Authorizing Provider  acetaminophen (TYLENOL) 325 MG tablet Take 650 mg by mouth every 6 (six) hours as needed.   Yes [provider]  amLODipine (NORVASC) 5 MG tablet Take 5 mg by mouth daily as needed (high blood pressure).   Yes [provider]  diazepam (VALIUM) 5 MG tablet Take 1 tablet (5 mg total) by mouth every 8 (eight) hours as needed for anxiety. 06/28/16  Yes Ward, Ozella Almond, PA-C  gabapentin (NEURONTIN) 100 MG capsule Take 400 mg by mouth at bedtime.    Yes [provider]  hydrochlorothiazide (HYDRODIURIL) 25 MG tablet Take 25 mg by mouth every morning.   Yes [provider]  losartan (COZAAR) 50 MG tablet Take 100 mg by mouth daily.   Yes [provider]  meclizine (ANTIVERT) 25 MG tablet Take 1 tablet (25 mg total) by mouth 3 (three) times daily as needed for dizziness.  08/07/16  Yes Leo Grosser, MD  metoprolol succinate (TOPROL-XL) 50 MG 24 hr tablet Take 100 mg by mouth daily. Take with or immediately following a meal.   Yes [provider]  ranitidine (ZANTAC) 150 MG tablet Take 300 mg by mouth at bedtime.   Yes [provider]  traZODone (DESYREL) 50 MG tablet Take 50 mg by mouth at bedtime.   Yes [provider]  losartan-hydrochlorothiazide (HYZAAR) 100-25 MG tablet Take 1 tablet by mouth daily. Patient not taking: Reported on 01/28/2018  06/28/16   Ward, Ozella Almond, PA-C    Inpatient Medications: Scheduled Meds: . enoxaparin (LOVENOX) injection  40 mg Subcutaneous Q24H  . famotidine  20 mg Oral BID  . gabapentin  400 mg Oral QHS  . losartan  100 mg Oral Daily  . metoprolol succinate  100 mg Oral Daily  . potassium chloride  40 mEq Oral Once  . traZODone  50 mg Oral QHS   Continuous Infusions:  PRN Meds: acetaminophen, alum & mag hydroxide-simeth, amLODipine, diazepam, meclizine, ondansetron (ZOFRAN) IV  Allergies:    Allergies  Allergen Reactions  . Aspirin Swelling    Social History:   Social History   Socioeconomic History  . Marital status: Single    Spouse name: Not on file  . Number of children: Not on file  . Years of education: Not on file  . Highest education level: Not on file  Occupational History  . Not on file  Social Needs  . Financial resource strain: Not on file  . Food insecurity:    Worry: Not on file    Inability: Not on file  . Transportation needs:    Medical: Not on file    Non-medical: Not on file  Tobacco Use  . Smoking status: Never Smoker  . Smokeless tobacco: Never Used  Substance and Sexual Activity  . Alcohol use: No  . Drug use: No  . Sexual activity: Not on file  Lifestyle  . Physical activity:    Days per week: Not on file    Minutes per session: Not on file  . Stress: Not on file  Relationships  . Social connections:    Talks on phone: Not on file    Gets together: Not on file    Attends religious service: Not on file    Active member of club or organization: Not on file    Attends meetings of clubs or organizations: Not on file    Relationship status: Not on file  . Intimate partner violence:    Fear of current or ex partner: Not on file    Emotionally abused: Not on file    Physically abused: Not on file    Forced sexual activity: Not on file  Other Topics Concern  . Not on file  Social History Narrative  . Not on file    Family History:     Family History  Problem Relation Age of Onset  . Hypertension Mother   . Hypertension Father   . Diabetes Father   . Asthma Father   . Kidney failure Father    Family Status:  Family Status  Relation Name Status  . Mother  Alive  . Father  Deceased    ROS:  Please see the history of present illness.  All other ROS reviewed and negative.     Physical Exam/Data:   Vitals:   01/28/18 0100 01/28/18 0110 01/28/18 0135 01/28/18 0430  BP: (!) 110/54  134/85 106/71  Pulse: (!) 59  69 62  Resp: 13  18 14   Temp:   98 F (36.7 C) 98.2 F (36.8 C)  TempSrc:   Oral Oral  SpO2: 98%  100% 97%  Weight:  234 lb 12.8 oz (106.5 kg)    Height:  5\' 6"  (1.676 m)      Intake/Output Summary (Last 24 hours) at 01/28/2018 0704 Last data filed at 01/28/2018 0511 Gross per 24 hour  Intake -  Output 700 ml  Net -700 ml   Filed Weights   01/28/18 0110  Weight: 234 lb 12.8 oz (106.5 kg)   Body mass index is 37.9 kg/m.   General: Well developed, well nourished, NAD Skin: Warm, dry, intact  Head: Normocephalic, atraumatic, clear, moist mucus membranes. Neck: Negative for carotid bruits. No JVD Lungs:Clear to ausculation bilaterally. No wheezes, rales, or rhonchi. Breathing is unlabored. Cardiovascular: RRR with S1 S2. No murmurs, rubs or gallops Abdomen: Soft, non-tender, non-distended with normoactive bowel sounds. No obvious abdominal masses. MSK: Strength and tone appear normal for age. 5/5 in all extremities Extremities: No edema. No clubbing or cyanosis. DP/PT pulses 2+ bilaterally Neuro: Alert and oriented. No focal deficits. No facial asymmetry. MAE spontaneously. Psych: Responds to questions appropriately with normal affect.     EKG:  The EKG was personally reviewed and demonstrates: 01/28/18 NSR with no acute ST-T wave abnormalities.  Telemetry:  Telemetry was personally reviewed and demonstrates:  01/28/18 NSR, no arrhthymias per tele review   Relevant CV Studies:  ECHO:  Pending   CATH: Not on file   Laboratory Data:  Chemistry Recent Labs  Lab 01/27/18 2222  NA 135  K 2.4*  CL 98  CO2 25  GLUCOSE 141*  BUN 16  CREATININE 0.87  CALCIUM 9.4  GFRNONAA >60  GFRAA >60  ANIONGAP 12    Total Protein  Date Value Ref Range Status  01/28/2018 7.2 6.5 - 8.1 g/dL Final   Albumin  Date Value Ref Range Status  01/28/2018 3.8 3.5 - 5.0 g/dL Final   AST  Date Value Ref Range Status  01/28/2018 29 15 - 41 U/L Final   ALT  Date Value Ref Range Status  01/28/2018 29 0 - 44 U/L Final    Comment:    Please note change in reference range.   Alkaline Phosphatase  Date Value Ref Range Status  01/28/2018 47 38 - 126 U/L Final   Total Bilirubin  Date Value Ref Range Status  01/28/2018 0.8 0.3 - 1.2 mg/dL Final   Hematology Recent Labs  Lab 01/27/18 2222  WBC 10.9*  RBC 4.98  HGB 14.7  HCT 44.6  MCV 89.6  MCH 29.5  MCHC 33.0  RDW 12.8  PLT 207   Cardiac Enzymes Recent Labs  Lab 01/28/18 0522  TROPONINI <0.03    Recent Labs  Lab 01/27/18 2228  TROPIPOC 0.00    BNPNo results for input(s): BNP, PROBNP in the last 168 hours.  DDimer No results for input(s): DDIMER in the last 168 hours. TSH: No results found for: TSH Lipids:No results found for: CHOL, HDL, LDLCALC, LDLDIRECT, TRIG, CHOLHDL HgbA1c:No results found for: HGBA1C  Radiology/Studies:  Dg Chest 2 View  Result Date: 01/27/2018 CLINICAL DATA:  Chest pain and dizziness.  Nausea and vomiting. EXAM: CHEST - 2 VIEW COMPARISON:  None. FINDINGS: The heart size and mediastinal contours are within normal limits. Both lungs are clear. The visualized skeletal structures are unremarkable. IMPRESSION: No active cardiopulmonary disease. Electronically Signed  By: Lucienne Capers M.D.   On: 01/27/2018 23:07   Assessment and Plan:   1. Chest Pain with hx of CAD: -Pt presented to [facility] on 01/27/18 with acute onset chest pain with associated diaphoresis and vomiting. She states that she  has intermittently been experiencing similar pressure over the last several years and began workup with her cardiologist in [place]  However she moved and this was never completed. She has a hx of remote cardiac cath in 2014 with unknown results. She recently moved to the [place] from [place] approximately 3 months ago and is to bring her health records for review.   -EKG, unremarkable with no acute ST-T wave abnormalities -Troponin, <0.03. Will continue to trend -Echocardiogram with pending results -Continues to have 5/10 left sided chest pressure -Consider further ischemic workup with coronary CT tomorrow given her good renal function and HR. If enzymes elevate, can place on Hep gtt and evaluate with possible cath  -Known allergy to ASA  -Continue BB -Currently NPO  2. HTN: -Stable, 110/54>103/60>117/74 -Currently on losartan 100mg , Toprol-Xl 100 -Home losartan 50, amlodipine 5 -Will continue to monitor, adjust medications as needed based on results  3. Hypokalemia: -K+ on admission, 2.4 -Supplemented with 38meq total since admission >>per primary team  -Not currently on home diuretic -Likely in the setting of vomiting?  -LFT's, normal AST 29, ALT 29 -Monitor closely with BMET   4. HLD: -Will check Lipid panel and adjust medications as needed   For questions or updates, please contact [place] Please consult [email or web address] for contact info under Cardiology/STEMI.   Lyndel Safe NP-C [place] Pager: [phone] 01/28/2018 7:04 AM    Patient seen and discussed with PA Glenford Peers, I agree with her documentation.  57 yo female history of HTN, unclear self reported history of CAD, recently moved from [place]. She presents with episode of nausea vomiting and chest pain.  She reports a several year history of intermittent N/V and chest pain. Chest pain can occur in the absence of nausea and vomiting, sometimes can be exertional. Intermittent palitations as well than  can sometimes be associated and sometimes independent. She reports her doctors in [place] were considering both either a repeat stress/cath and possible heart monitor.   This episode occurred while patient in the kitchen. Initial onset of nausea and vomiting, followed later by severe chest pain/epigastric pain up to 8/10. Comes and goes, at longest can last up to 3-4 hours. Not positional.    K 2.4 Cr 0.87 WBC 10.9 Plt 207  CXR no acute process Trop neg x 2 EKG SR, no ischemic changes  Several year history of intermittent nausea/vomiting, chest pain, and palpitaitons. At times they can come together, at times each symptom can be independent. Can have some exertional chest pain at times. Overall difficult to discern the etiology of her symptoms, particularly since her chest pain is not always associated with GI symptoms. We will follow trop trends today, f/u echo. Would lean toward noninvasive ischemic evaluation tomorrow unless workup today comes back more convincing of ischemia. Her family is to bring in her records from [place]. If negative ischemic evaluation consider home moniotor as she often has episodes of palpitations with her chest pain as well, follow telemetry here. She has aspirin allergy, start plavix today, d/c if negative ischemic evaluation.    Carlyle Dolly MD

## 2018-01-28 NOTE — H&P (Signed)
History and Physical    Crystal Lowe DOB: 1961/04/28 DOA: 01/27/2018  PCP: Patient, No Pcp Per  Patient coming from: Home.  Chief Complaint: Chest pain.  HPI: Crystal Lowe is a 57 y.o. female with history of hypertension, CAD who just recently moved to [place] from [place] 3 months ago presents to the ER with complaints of chest pain.  Patient says last evening at home around 6 PM patient bent over to take something out of the oven when patient started feeling dizzy and started throwing up multiple times.  When she drove the fourth time patient started developing substernal chest pressure nonradiating present at rest.  Associated diaphoresis.  Patient decided to come to the ER.  Denies any shortness of breath abdominal pain productive cough fever or chills.  Patient states 5 years ago when she was in [place] she had a cardiac cath results of which she is not exactly sure.  ED Course: In the ER patient chest pain improved with fentanyl.  Patient also had severe hypokalemia.  Was replaced.  Patient admitted for chest pain work-up.  Review of Systems: As per HPI, rest all negative.   Past Medical History:  Diagnosis Date  . High cholesterol   . Hypertension   . MI (myocardial infarction) [facility])     Past Surgical History:  Procedure Laterality Date  . ABDOMINAL HYSTERECTOMY    . CARDIAC SURGERY       reports that she has never smoked. She has never used smokeless tobacco. She reports that she does not drink alcohol or use drugs.  Allergies  Allergen Reactions  . Aspirin Swelling    Family History  Problem Relation Age of Onset  . Hypertension Mother   . Hypertension Father   . Diabetes Father   . Asthma Father   . Kidney failure Father     Prior to Admission medications   Medication Sig Start Date End Date Taking? Authorizing Provider  acetaminophen (TYLENOL) 325 MG tablet Take 650 mg by mouth every 6 (six) hours as needed.    Yes [provider]  amLODipine (NORVASC) 5 MG tablet Take 5 mg by mouth daily as needed (high blood pressure).   Yes [provider]  diazepam (VALIUM) 5 MG tablet Take 1 tablet (5 mg total) by mouth every 8 (eight) hours as needed for anxiety. 06/28/16  Yes Ward, Ozella Almond, PA-C  gabapentin (NEURONTIN) 100 MG capsule Take 400 mg by mouth at bedtime.    Yes [provider]  hydrochlorothiazide (HYDRODIURIL) 25 MG tablet Take 25 mg by mouth every morning.   Yes [provider]  losartan (COZAAR) 50 MG tablet Take 100 mg by mouth daily.   Yes [provider]  meclizine (ANTIVERT) 25 MG tablet Take 1 tablet (25 mg total) by mouth 3 (three) times daily as needed for dizziness. 08/07/16  Yes Leo Grosser, MD  metoprolol succinate (TOPROL-XL) 50 MG 24 hr tablet Take 100 mg by mouth daily. Take with or immediately following a meal.   Yes [provider]  ranitidine (ZANTAC) 150 MG tablet Take 300 mg by mouth at bedtime.   Yes [provider]  traZODone (DESYREL) 50 MG tablet Take 50 mg by mouth at bedtime.   Yes [provider]  losartan-hydrochlorothiazide (HYZAAR) 100-25 MG tablet Take 1 tablet by mouth daily. Patient not taking: Reported on 01/28/2018 06/28/16   Ward, Ozella Almond, PA-C    Physical Exam: Vitals:   01/28/18  0045 01/28/18 0100 01/28/18 0110 01/28/18 0135  BP: 103/60 (!) 110/54  134/85  Pulse: 65 (!) 59  69  Resp: 12 13  18   Temp:    98 F (36.7 C)  TempSrc:    Oral  SpO2: 99% 98%  100%  Weight:   106.5 kg (234 lb 12.8 oz)   Height:   5\' 6"  (1.676 m)       Constitutional: Moderately built and nourished. Vitals:   01/28/18 0045 01/28/18 0100 01/28/18 0110 01/28/18 0135  BP: 103/60 (!) 110/54  134/85  Pulse: 65 (!) 59  69  Resp: 12 13  18   Temp:    98 F (36.7 C)  TempSrc:    Oral  SpO2: 99% 98%  100%  Weight:   106.5 kg (234 lb 12.8 oz)   Height:   5\' 6"  (1.676 m)    Eyes: Anicteric no  pallor. ENMT: No discharge from the ears eyes nose or mouth. Neck: No mass felt.  No JVD appreciated. Respiratory: No rhonchi or crepitations. Cardiovascular: S1-S2 heard no murmurs appreciated. Abdomen: Soft nontender bowel sounds present. Musculoskeletal: No edema. Skin: No rash. Neurologic: Alert awake oriented to time place and person.  Moves all extremities. Psychiatric: Appears normal.  Normal affect.   Labs on Admission: I have personally reviewed following labs and imaging studies  CBC: Recent Labs  Lab 01/27/18 2222  WBC 10.9*  HGB 14.7  HCT 44.6  MCV 89.6  PLT 409   Basic Metabolic Panel: Recent Labs  Lab 01/27/18 2222  NA 135  K 2.4*  CL 98  CO2 25  GLUCOSE 141*  BUN 16  CREATININE 0.87  CALCIUM 9.4   GFR: Estimated Creatinine Clearance: 91.3 mL/min (by C-G formula based on SCr of 0.87 mg/dL). Liver Function Tests: No results for input(s): AST, ALT, ALKPHOS, BILITOT, PROT, ALBUMIN in the last 168 hours. No results for input(s): LIPASE, AMYLASE in the last 168 hours. No results for input(s): AMMONIA in the last 168 hours. Coagulation Profile: No results for input(s): INR, PROTIME in the last 168 hours. Cardiac Enzymes: No results for input(s): CKTOTAL, CKMB, CKMBINDEX, TROPONINI in the last 168 hours. BNP (last 3 results) No results for input(s): PROBNP in the last 8760 hours. HbA1C: No results for input(s): HGBA1C in the last 72 hours. CBG: No results for input(s): GLUCAP in the last 168 hours. Lipid Profile: No results for input(s): CHOL, HDL, LDLCALC, TRIG, CHOLHDL, LDLDIRECT in the last 72 hours. Thyroid Function Tests: No results for input(s): TSH, T4TOTAL, FREET4, T3FREE, THYROIDAB in the last 72 hours. Anemia Panel: No results for input(s): VITAMINB12, FOLATE, FERRITIN, TIBC, IRON, RETICCTPCT in the last 72 hours. Urine analysis:    Component Value Date/Time   COLORURINE YELLOW 08/07/2016 1500   APPEARANCEUR CLEAR 08/07/2016 1500    LABSPEC 1.016 08/07/2016 1500   PHURINE 6.0 08/07/2016 1500   GLUCOSEU NEGATIVE 08/07/2016 1500   HGBUR NEGATIVE 08/07/2016 1500   BILIRUBINUR NEGATIVE 08/07/2016 1500   KETONESUR NEGATIVE 08/07/2016 1500   PROTEINUR NEGATIVE 08/07/2016 1500   NITRITE NEGATIVE 08/07/2016 1500   LEUKOCYTESUR TRACE (A) 08/07/2016 1500   Sepsis Labs: @LABRCNTIP (procalcitonin:4,lacticidven:4) )No results found for this or any previous visit (from the past 240 hour(s)).   Radiological Exams on Admission: Dg Chest 2 View  Result Date: 01/27/2018 CLINICAL DATA:  Chest pain and dizziness.  Nausea and vomiting. EXAM: CHEST - 2 VIEW COMPARISON:  None. FINDINGS: The heart size and mediastinal contours are within normal limits. Both  lungs are clear. The visualized skeletal structures are unremarkable. IMPRESSION: No active cardiopulmonary disease. Electronically Signed   By: Lucienne Capers M.D.   On: 01/27/2018 23:07    EKG: Independently reviewed.  Normal sinus rhythm.  Assessment/Plan Active Problems:   Chest pain   Hypokalemia   Essential hypertension    1. Chest pain -patient states she has had a history of CAD while she was in [place] 5 years ago.  History is not clear.  Will cycle cardiac markers check LFTs check 2D echo patient is allergic to aspirin.  Presently chest pain-free.  Will consult cardiology.  Keep n.p.o. in anticipation of procedure. 2. Hypertension on amlodipine Cozaar metoprolol.  Hold hydrochlorothiazide due to hyperkalemia.  PRN IV hydralazine. 3. Hypokalemia likely from nausea vomiting.  Replace recheck check magnesium levels. 4. Nausea vomiting -cause not clear.  Vomiting is subsided.  Check LFTs.  Abdomen appears benign.  If there is any further vomiting or LFTs abnormal will have further imaging done.   DVT prophylaxis: Lovenox. Code Status: Full code. Family Communication: Discussed with patient. Disposition Plan: Home. Consults called: Cardiology. Admission status:  Observation.   Rise Patience MD [facility] Pager [phone].  If 7PM-7AM, please contact night-coverage [email or web address] Password [email or web address]  01/28/2018, 4:21 AM

## 2018-01-28 NOTE — Progress Notes (Signed)
PROGRESS NOTE    Crystal Lowe  EXB:[id number] DOB: 12/10/1960 DOA: 01/27/2018 PCP: Patient, No Pcp Per      Brief Narrative:  [name] is a 57 y.o. F with CAD, HTN who presents with chest pain.  Atypical, presented after bending over.  Not clearly exertional.   Assessment & Plan:  Chest pain Echo shows normal EF, no RWMA.  Troponins negative. -Consult to Cardiology -CT coronary ordered per Cardiology recommendations   Hypertension Coronary disease Reports from [place] reviewed.  In 2013, she had a nuclear stress, this showed apical defect, reversible, but follow up cath showed only nonobstructive disease. -Continue Plavix -Continue losartan, metoprolol -Has allergy to aspirin  Hypokalemia Improving, still low.  Mag normal. -Continue K one more dose      DVT prophylaxis: Lovenox Code Status: FULL Family Communication: Children at bedside MDM and disposition Plan: The below labs and imaging reports were reviewed and summarized above.  This is a no charge note, for further details, please see H&P from my partner Dr. Hal Hope from earlier today.  The patient was admitted with chest pain, plan for stress tomorrow.  If normal, home after.   Consultants:   Cardiology  Procedures:   Echocardiogra  Antimicrobials:   None    Subjective: Mild stable chest pain at this time.  Objective: Vitals:   01/28/18 0430 01/28/18 0757 01/28/18 0806 01/28/18 1100  BP: 106/71 122/76  114/74  Pulse: 62  67 (!) 57  Resp: 14 16  20   Temp: 98.2 F (36.8 C) 98.6 F (37 C)  98.6 F (37 C)  TempSrc: Oral Oral  Oral  SpO2: 97% 100%  100%  Weight:      Height:        Intake/Output Summary (Last 24 hours) at 01/28/2018 1538 Last data filed at 01/28/2018 0900 Gross per 24 hour  Intake 480 ml  Output 700 ml  Net -220 ml   Filed Weights   01/28/18 0110  Weight: 106.5 kg (234 lb 12.8 oz)    Examination: The patient was seen and examined.  Exam  nonfocal.   Data Reviewed: I have personally reviewed following labs and imaging studies:  CBC: Recent Labs  Lab 01/27/18 2222  WBC 10.9*  HGB 14.7  HCT 44.6  MCV 89.6  PLT 102   Basic Metabolic Panel: Recent Labs  Lab 01/27/18 2222 01/28/18 0522 01/28/18 0845  NA 135  --   --   K 2.4*  --  3.3*  CL 98  --   --   CO2 25  --   --   GLUCOSE 141*  --   --   BUN 16  --   --   CREATININE 0.87  --   --   CALCIUM 9.4  --   --   MG  --  2.0  --    GFR: Estimated Creatinine Clearance: 91.3 mL/min (by C-G formula based on SCr of 0.87 mg/dL). Liver Function Tests: Recent Labs  Lab 01/28/18 0522  AST 29  ALT 29  ALKPHOS 47  BILITOT 0.8  PROT 7.2  ALBUMIN 3.8   No results for input(s): LIPASE, AMYLASE in the last 168 hours. No results for input(s): AMMONIA in the last 168 hours. Coagulation Profile: No results for input(s): INR, PROTIME in the last 168 hours. Cardiac Enzymes: Recent Labs  Lab 01/28/18 0522 01/28/18 0845  TROPONINI <0.03 <0.03   BNP (last 3 results) No results for input(s): PROBNP in the last  8760 hours. HbA1C: No results for input(s): HGBA1C in the last 72 hours. CBG: No results for input(s): GLUCAP in the last 168 hours. Lipid Profile: Recent Labs    01/28/18 0845  CHOL 186  HDL 38*  LDLCALC 98  TRIG 251*  CHOLHDL 4.9   Thyroid Function Tests: No results for input(s): TSH, T4TOTAL, FREET4, T3FREE, THYROIDAB in the last 72 hours. Anemia Panel: No results for input(s): VITAMINB12, FOLATE, FERRITIN, TIBC, IRON, RETICCTPCT in the last 72 hours. Urine analysis:    Component Value Date/Time   COLORURINE YELLOW 08/07/2016 1500   APPEARANCEUR CLEAR 08/07/2016 1500   LABSPEC 1.016 08/07/2016 1500   PHURINE 6.0 08/07/2016 1500   GLUCOSEU NEGATIVE 08/07/2016 1500   HGBUR NEGATIVE 08/07/2016 1500   BILIRUBINUR NEGATIVE 08/07/2016 1500   KETONESUR NEGATIVE 08/07/2016 1500   PROTEINUR NEGATIVE 08/07/2016 1500   NITRITE NEGATIVE 08/07/2016  1500   LEUKOCYTESUR TRACE (A) 08/07/2016 1500   Sepsis Labs: @LABRCNTIP (procalcitonin:4,lacticacidven:4)  )No results found for this or any previous visit (from the past 240 hour(s)).       Radiology Studies: Dg Chest 2 View  Result Date: 01/27/2018 CLINICAL DATA:  Chest pain and dizziness.  Nausea and vomiting. EXAM: CHEST - 2 VIEW COMPARISON:  None. FINDINGS: The heart size and mediastinal contours are within normal limits. Both lungs are clear. The visualized skeletal structures are unremarkable. IMPRESSION: No active cardiopulmonary disease. Electronically Signed   By: Lucienne Capers M.D.   On: 01/27/2018 23:07        Scheduled Meds: . clopidogrel  75 mg Oral Daily  . enoxaparin (LOVENOX) injection  40 mg Subcutaneous Q24H  . famotidine  20 mg Oral BID  . gabapentin  400 mg Oral QHS  . losartan  100 mg Oral Daily  . metoprolol succinate  100 mg Oral Daily  . traZODone  50 mg Oral QHS   Continuous Infusions:   LOS: 0 days    Time spent: 25 minutes    Edwin Dada, MD [facility] 01/28/2018, 3:38 PM     Pager [phone] --- please page though AMION:  [email or web address] If 7PM-7AM, please contact night-coverage

## 2018-01-28 NOTE — Progress Notes (Signed)
Patient admitted, oriented to unit, and placed on telemetry. Admission completed with assistance of interpreter. Pt states she need help with her fibromyalgia pain. Says a previous provider told her to "learn how to deal with her pain". Pt says at its worse pain makes her think of "ending it" but she then thinks of her children and grandchildren and has no plans of hurting herself.  Pt states she is not in any pain now. Admitting provider notified.

## 2018-01-28 NOTE — Progress Notes (Signed)
Nutrition Brief Note  Patient identified on the Malnutrition Screening Tool (MST) Report. Patient reports usual weight 269 lbs a few months ago, now 234 lbs. Weight from 1.5 years ago is consistent with current weight. Patient says she has been walking a lot lately. Nutrition focused physical exam completed.  No muscle or subcutaneous fat depletion noticed. Suspect reported weight of 269 lbs is inaccurate.    Wt Readings from Last 15 Encounters:  01/28/18 234 lb 12.8 oz (106.5 kg)  08/07/16 231 lb (104.8 kg)    Body mass index is 37.9 kg/m. Patient meets criteria for obesity based on current BMI.   Current diet order is heart heatlhy, patient reports good appetite and good intake. Labs and medications reviewed.   No nutrition interventions warranted at this time. If nutrition issues arise, please consult RD.   Molli Barrows, RD, LDN, [place] Pager [phone] After Hours Pager [phone]

## 2018-01-28 NOTE — Progress Notes (Signed)
Responded to [facility] to assist patient with AD.  Document left with staff for patient .  Patient bathing. Patient and nurse said upon readiness they will page Chaplain services. Chaplain available as needed.  Jaclynn Major, [place], [facility], Pager [phone]

## 2018-01-29 ENCOUNTER — Observation Stay (HOSPITAL_COMMUNITY): Payer: Medicaid Other

## 2018-01-29 DIAGNOSIS — I10 Essential (primary) hypertension: Secondary | ICD-10-CM

## 2018-01-29 DIAGNOSIS — R079 Chest pain, unspecified: Secondary | ICD-10-CM

## 2018-01-29 DIAGNOSIS — E876 Hypokalemia: Secondary | ICD-10-CM

## 2018-01-29 LAB — HEMOGLOBIN A1C
Hgb A1c MFr Bld: 5.9 % — ABNORMAL HIGH (ref 4.8–5.6)
MEAN PLASMA GLUCOSE: 122.63 mg/dL

## 2018-01-29 LAB — LIPID PANEL
CHOL/HDL RATIO: 4.8 ratio
Cholesterol: 181 mg/dL (ref 0–200)
HDL: 38 mg/dL — AB (ref >40)
LDL CALC: 99 mg/dL (ref 0–99)
Triglycerides: 219 mg/dL — ABNORMAL HIGH (ref <150)
VLDL: 44 mg/dL — AB (ref 0–40)

## 2018-01-29 LAB — HIV ANTIBODY (ROUTINE TESTING W REFLEX): HIV SCREEN 4TH GENERATION: NONREACTIVE

## 2018-01-29 MED ORDER — NITROGLYCERIN 0.4 MG SL SUBL
SUBLINGUAL_TABLET | SUBLINGUAL | Status: AC
  Administered 2018-01-29: 0.8 mg
  Filled 2018-01-29: qty 2

## 2018-01-29 MED ORDER — IOPAMIDOL (ISOVUE-370) INJECTION 76%
INTRAVENOUS | Status: AC
  Administered 2018-01-29: 80 mL
  Filled 2018-01-29: qty 100

## 2018-01-29 MED ORDER — ONDANSETRON HCL 4 MG PO TABS: 4.0000 mg | ORAL_TABLET | Freq: Every day | ORAL | 1 refills | Status: DC | PRN

## 2018-01-29 NOTE — Progress Notes (Signed)
Progress Note  Patient Name: Crystal Lowe Date of Encounter: 01/29/2018  Primary Cardiologist: No primary care provider on file.   Subjective   No chest pain this AM. Seen prior to CTA, explained procedure, no additional questions. No change in pain with change in position, not pleuritic. Denies fevers, chills, or recent infections.   Inpatient Medications    Scheduled Meds: . clopidogrel  75 mg Oral Daily  . enoxaparin (LOVENOX) injection  40 mg Subcutaneous Q24H  . famotidine  20 mg Oral BID  . gabapentin  400 mg Oral QHS  . losartan  100 mg Oral Daily  . metoprolol succinate  100 mg Oral Daily  . traZODone  50 mg Oral QHS   Continuous Infusions:  PRN Meds: acetaminophen, alum & mag hydroxide-simeth, amLODipine, diazepam, meclizine, ondansetron (ZOFRAN) IV   Vital Signs    Vitals:   01/29/18 0007 01/29/18 0414 01/29/18 0630 01/29/18 0807  BP: 100/63 (!) 89/59 102/73 110/73  Pulse: (!) 59 (!) 57 (!) 54 (!) 58  Resp: 18 18  18   Temp: 98.4 F (36.9 C) 97.6 F (36.4 C)  98 F (36.7 C)  TempSrc: Oral Oral  Oral  SpO2: 100% 97%  99%  Weight:  232 lb 12.9 oz (105.6 kg)    Height:        Intake/Output Summary (Last 24 hours) at 01/29/2018 1140 Last data filed at 01/29/2018 0519 Gross per 24 hour  Intake 600 ml  Output 725 ml  Net -125 ml   Filed Weights   01/28/18 0110 01/29/18 0414  Weight: 234 lb 12.8 oz (106.5 kg) 232 lb 12.9 oz (105.6 kg)    Telemetry    Normal sinus rhythm and sinus bradycardia - Personally Reviewed  ECG    Normal sinus rhythm - Personally Reviewed  Physical Exam   GEN: No acute distress, well nourished Neck: supple, no JVD Cardiac: regular S1 and S2, no murmurs or gallops. Quiet rub only around upper sternal region, not heard in midclavicular field. Respiratory: Clear to auscultation bilaterally. GI: Soft, nontender, non-distended. Bowel sounds normal MS: No edema; No deformity. Neuro:  Nonfocal, moves all limbs  independently Psych: Normal affect   Labs    Chemistry Recent Labs  Lab 01/27/18 2222 01/28/18 0522 01/28/18 0845  NA 135  --   --   K 2.4*  --  3.3*  CL 98  --   --   CO2 25  --   --   GLUCOSE 141*  --   --   BUN 16  --   --   CREATININE 0.87  --   --   CALCIUM 9.4  --   --   PROT  --  7.2  --   ALBUMIN  --  3.8  --   AST  --  29  --   ALT  --  29  --   ALKPHOS  --  47  --   BILITOT  --  0.8  --   GFRNONAA >60  --   --   GFRAA >60  --   --   ANIONGAP 12  --   --      Hematology Recent Labs  Lab 01/27/18 2222  WBC 10.9*  RBC 4.98  HGB 14.7  HCT 44.6  MCV 89.6  MCH 29.5  MCHC 33.0  RDW 12.8  PLT 207    Cardiac Enzymes Recent Labs  Lab 01/28/18 0522 01/28/18 0845 01/28/18 1544  TROPONINI <0.03 <0.03 <  0.03    Recent Labs  Lab 01/27/18 2228  TROPIPOC 0.00     BNPNo results for input(s): BNP, PROBNP in the last 168 hours.   DDimer No results for input(s): DDIMER in the last 168 hours.   Radiology    Dg Chest 2 View  Result Date: 01/27/2018 CLINICAL DATA:  Chest pain and dizziness.  Nausea and vomiting. EXAM: CHEST - 2 VIEW COMPARISON:  None. FINDINGS: The heart size and mediastinal contours are within normal limits. Both lungs are clear. The visualized skeletal structures are unremarkable. IMPRESSION: No active cardiopulmonary disease. Electronically Signed   By: Lucienne Capers M.D.   On: 01/27/2018 23:07    Cardiac Studies   Echo 01/28/18 personally reviewed--normal EF, no regional WMA  CTA 01/29/18: IMPRESSION: 1. Coronary calcium score of 0. This was 0 percentile for age and sex matched control.  2. Normal coronary origin with right dominance.  3. Minimal CAD in the proximal LAD, otherwise normal coronaries.  Patient Profile     57 y.o. female with PMH hypertension, hyperlipidemia, and reported CAD (no prior cath notes available) who is seen at the request of Dr. Hal Hope for evaluation of chest pain.  Assessment & Plan    1.  Chest Pain with hx of CAD: -Has been having intermittent chest pain over several years, had an acute episode on 7/3 prompting emergency evaluation. Associated with diaphoresis and vomiting. Denies fevers/chills to me. troponins negative, ECG unremarkable, echo with normal EF and no WMA. Does have quiet rub only over sternal area and slightly elevated white count. Had prior cardiac cath with unknown results. She has a hx of remote cardiac cath in 2014 with unknown results. She recently moved to the [place] from [place] approximately 3 months ago and is to bring her health records for review.   -No chest pain this AM, though has not been ambulatory. Denies change in pain with position -Coronary CTA completed this AM, read as above. CT calcium score 0, no obstructive CAD.  -Known allergy to ASA. Started on clopidogrel pending CTA, will stop given results. -Continue BB -no palpitations now, no events on telemetry. If recur evaluate with home monitor as outpatient.  2. HTN: -Currently well controlled -Continue losartan 100mg , Toprol-Xl 100mg  daily -Home losartan 100mg , HCTZ 25 mg (?taking), amlodipine 5 PRN. Has not received amlodipine in hospital per [facility]. Not a good PRN med. Would hold HCTZ given her low K on presentation until she is followed up.  3. Hypokalemia: -K+ on admission, 2.4, supplementation per primary team -if remains low as outpatient, and BP elevates, consider workup for hyperaldosteronism  4. HLD: -Tchol 181, TG 219, HDL 38, LDL 99. Pattern c/w metabolic syndrome. A1c 5.9. Recommend trial of lifestyle intervention as outpatient. Evaluate for statin based on results of this.    Time Spent Directly with Patient: I have spent a total of >35 minutes with the patient reviewing hospital notes, telemetry, EKGs, labs and examining the patient as well as establishing an assessment and plan that was discussed personally with the patient.  > 50% of time was spent in direct patient  care.  Length of Stay:  LOS: 0 days   Buford Dresser, MD, PhD [facility]   01/29/2018, 11:40 AM  [facility] will sign off.   Medication Recommendations:  BP meds as above. Other recommendations (labs, testing, etc):  Diet and exercise for metabolic syndrome risk factor modification Follow up as an outpatient:  Carlyle Dolly, [place]  For questions or updates, please contact [place] Please consult [email or web address] for contact info under Cardiology/STEMI.

## 2018-01-29 NOTE — Discharge Summary (Signed)
Physician Discharge Summary  Crystal Lowe JWJ:[id number] DOB: 06/09/1961 DOA: 01/27/2018  PCP: Medicine, [facility]  Admit date: 01/27/2018 Discharge date: 01/29/2018  Admitted From: Home  Disposition:  Home   Recommendations for Outpatient Follow-up:  1. Follow up with PCP in 1 week 2. Follow up with Cardiology in 1-2 weeks 3. Consider referral to PT for Benign paroxysmal positional vertigo 4. Please obtain BMP in one week to recheck potassium       Home Health: None  Equipment/Devices: None  Discharge Condition: Good  CODE STATUS: FULL Diet recommendation: Cardiac  Brief/Interim Summary: Crystal Lowe is a 57 y.o. F with CAD, HTN who presents with chest pain.  Atypical, presented after bending over.  Not clearly exertional.       Discharge Diagnoses:   Chest pain Echo shows normal EF, no RWMA.  Troponins negative.  Cardiology consulted, recommended coronary CT that showed minimal nonobstructive disease, CAC 0.       Vertigo This was actually patient's primary presenting complaint.  She has had for a long time periods of brief vertigo/moving sensation, worse with head movement, lasting hours associated with vomiting.  The night of admission, it was just a particularly severe episode.  She bent over, lifted her head, and the vertigo was so severe she vomited repeatedly.  It was only after that that the chest pain started. -Ondansetron provided -Follow up with PCP re: PT referral  Hypertension Coronary disease Hyperlipidemia Reports from [place] reviewed.  In 2013, she had a nuclear stress, this showed apical defect, reversible, but follow up cath showed only nonobstructive disease.  Continued on her losartan, amlodipine, metoprolol and HCTZ at discharge.  Hypokalemia Supplemented.   Likely from vomiting.  HCTZ restarted at discharge, if repeat K in 1 week is low, will need alternative to HCTZ.      Discharge  Instructions  Discharge Instructions    Diet - low sodium heart healthy   Complete by:  As directed    Discharge instructions   Complete by:  As directed    From Dr. Loleta Books: You were admitted with chest pain. Thankfully, this was not from a heart attack this time. Your EKG (the electrical activity in your heart) and your heart enzymes (the blood tests looking for heart damage) were all normal. Because of your previous heart attack in [place] in 2013, we did a further test of your heart called a "coronary CT".  This showed that you have no significant blockages in the arteries of your heart.  See your primary care doctor in 1 week. Have them check your potassium level.   Call the Cardiologist (heart doctor) at [phone] to reschedule your appointment next week if needed.  For your medicines: Resume all your home blood pressure medicines Have Cardiology check your blood pressure when you see them in a few weeks    In case this chest pain was all from heart burn, try adding a medicine for heart burn to your regimen: pantoprazole 40 mg daily for 2 weeks, then stop.   The dizziness sounds like BPPV or benign paroxysmal positional vertigo. Talk to your primary care doctor about what to do about this.   Increase activity slowly   Complete by:  As directed      Allergies as of 01/29/2018      Reactions   Aspirin Swelling      Medication List    STOP taking these medications   hydrochlorothiazide 25 MG tablet  Commonly known as:  HYDRODIURIL   losartan-hydrochlorothiazide 100-25 MG tablet Commonly known as:  HYZAAR     TAKE these medications   acetaminophen 325 MG tablet Commonly known as:  TYLENOL Take 650 mg by mouth every 6 (six) hours as needed.   amLODipine 5 MG tablet Commonly known as:  NORVASC Take 5 mg by mouth daily as needed (high blood pressure).   diazepam 5 MG tablet Commonly known as:  VALIUM Take 1 tablet (5 mg total) by mouth every 8 (eight)  hours as needed for anxiety.   gabapentin 100 MG capsule Commonly known as:  NEURONTIN Take 400 mg by mouth at bedtime.   losartan 50 MG tablet Commonly known as:  COZAAR Take 100 mg by mouth daily.   meclizine 25 MG tablet Commonly known as:  ANTIVERT Take 1 tablet (25 mg total) by mouth 3 (three) times daily as needed for dizziness.   metoprolol succinate 50 MG 24 hr tablet Commonly known as:  TOPROL-XL Take 100 mg by mouth daily. Take with or immediately following a meal.   ondansetron 4 MG tablet Commonly known as:  ZOFRAN Take 1 tablet (4 mg total) by mouth daily as needed for nausea or vomiting.   ranitidine 150 MG tablet Commonly known as:  ZANTAC Take 300 mg by mouth at bedtime.   traZODone 50 MG tablet Commonly known as:  DESYREL Take 50 mg by mouth at bedtime.       Allergies  Allergen Reactions  . Aspirin Swelling    Consultations:  Cardiology   Procedures/Studies: Dg Chest 2 View  Result Date: 01/27/2018 CLINICAL DATA:  Chest pain and dizziness.  Nausea and vomiting. EXAM: CHEST - 2 VIEW COMPARISON:  None. FINDINGS: The heart size and mediastinal contours are within normal limits. Both lungs are clear. The visualized skeletal structures are unremarkable. IMPRESSION: No active cardiopulmonary disease. Electronically Signed   By: Lucienne Capers M.D.   On: 01/27/2018 23:07   Ct Coronary Morph W/cta Cor W/score W/ca W/cm &/or Wo/cm  Addendum Date: 01/29/2018   ADDENDUM REPORT: 01/29/2018 11:56 CLINICAL DATA:  57 year old female with h/o hypertension, hyperlipidemia, strong FH of premature CAD and chest pain. EXAM: Cardiac/Coronary  CT TECHNIQUE: The patient was scanned on a [employer]. FINDINGS: A 120 kV prospective scan was triggered in the descending thoracic aorta at 111 HU's. Axial non-contrast 3 mm slices were carried out through the heart. The data set was analyzed on a dedicated work station and scored using the [employer]. Gantry  rotation speed was 250 msecs and collimation was .6 mm. No beta blockade and 0.8 mg of sl NTG was given. The 3D data set was reconstructed in 5% intervals of the 67-82 % of the R-R cycle. Diastolic phases were analyzed on a dedicated work station using MPR, MIP and VRT modes. The patient received 80 cc of contrast. Aorta: Normal size. Trivial atheroma and calcifications. No dissection. Aortic Valve:  Trileaflet.  No calcifications. Coronary Arteries:  Normal coronary origin.  Right dominance. RCA is a medium size dominant artery that gives rise to PDA and PLVB. There is no plaque. Left main is a large artery that gives rise to LAD and LCX arteries. Left main has no plaque. LAD is a large vessel that gives rise to two diagonal arteries and has mild non-calcified plaque in the proximal LAD with associated stenosis 0-25%. LCX is a large non-dominant artery that gives rise to three OM branches. There is no plaque. Other findings:  Normal pulmonary vein drainage into the left atrium. Normal let atrial appendage without a thrombus. Normal size of the pulmonary artery. IMPRESSION: 1. Coronary calcium score of 0. This was 0 percentile for age and sex matched control. 2. Normal coronary origin with right dominance. 3. Minimal CAD in the proximal LAD, otherwise normal coronaries. Electronically Signed   By: Ena Dawley   On: 01/29/2018 11:56   Result Date: 01/29/2018 EXAM: OVER-READ INTERPRETATION  CT CHEST The following report is an over-read performed by radiologist Dr. Rolm Baptise of [facility], [place] on 01/29/2018. This over-read does not include interpretation of cardiac or coronary anatomy or pathology. The coronary CTA interpretation by the cardiologist is attached. COMPARISON:  None. FINDINGS: Vascular: Heart is normal size.  Aorta is normal caliber. Mediastinum/Nodes: No adenopathy in the lower mediastinum or hila. Lungs/Pleura: Visualized lungs clear.  No effusions. Upper Abdomen: Imaging into the upper  abdomen shows no acute findings. Musculoskeletal: Chest wall soft tissues are unremarkable. No acute bony abnormality. IMPRESSION: No acute or significant extracardiac abnormality. Electronically Signed: By: Rolm Baptise M.D. On: 01/29/2018 11:40       Subjective: Feels well.  No more chest pain.  No vertigo.  No nausea.  Discharge Exam: Vitals:   01/29/18 1158 01/29/18 1336  BP: 94/69 105/75  Pulse: (!) 53 (!) 54  Resp: 18 18  Temp: 98.3 F (36.8 C) 98.1 F (36.7 C)  SpO2: 99% 100%   Vitals:   01/29/18 0630 01/29/18 0807 01/29/18 1158 01/29/18 1336  BP: 102/73 110/73 94/69 105/75  Pulse: (!) 54 (!) 58 (!) 53 (!) 54  Resp:  18 18 18   Temp:  98 F (36.7 C) 98.3 F (36.8 C) 98.1 F (36.7 C)  TempSrc:  Oral Oral Oral  SpO2:  99% 99% 100%  Weight:      Height:        General: Pt is alert, awake, not in acute distress Cardiovascular: RRR, S1/S2 +, no rubs, no gallops Respiratory: CTA bilaterally, no wheezing, no rhonchi Abdominal: Soft, NT, ND, bowel sounds + Extremities: no edema, no cyanosis    The results of significant diagnostics from this hospitalization (including imaging, microbiology, ancillary and laboratory) are listed below for reference.     Microbiology: No results found for this or any previous visit (from the past 240 hour(s)).   Labs: BNP (last 3 results) No results for input(s): BNP in the last 8760 hours. Basic Metabolic Panel: Recent Labs  Lab 01/27/18 2222 01/28/18 0522 01/28/18 0845  NA 135  --   --   K 2.4*  --  3.3*  CL 98  --   --   CO2 25  --   --   GLUCOSE 141*  --   --   BUN 16  --   --   CREATININE 0.87  --   --   CALCIUM 9.4  --   --   MG  --  2.0  --    Liver Function Tests: Recent Labs  Lab 01/28/18 0522  AST 29  ALT 29  ALKPHOS 47  BILITOT 0.8  PROT 7.2  ALBUMIN 3.8   No results for input(s): LIPASE, AMYLASE in the last 168 hours. No results for input(s): AMMONIA in the last 168 hours. CBC: Recent Labs  Lab  01/27/18 2222  WBC 10.9*  HGB 14.7  HCT 44.6  MCV 89.6  PLT 207   Cardiac Enzymes: Recent Labs  Lab 01/28/18 0522 01/28/18 0845 01/28/18 1544  TROPONINI <0.03 <0.03 <0.03   BNP: Invalid input(s): POCBNP CBG: No results for input(s): GLUCAP in the last 168 hours. D-Dimer No results for input(s): DDIMER in the last 72 hours. Hgb A1c Recent Labs    01/29/18 0520  HGBA1C 5.9*   Lipid Profile Recent Labs    01/28/18 0845 01/29/18 0520  CHOL 186 181  HDL 38* 38*  LDLCALC 98 99  TRIG 251* 219*  CHOLHDL 4.9 4.8   Thyroid function studies No results for input(s): TSH, T4TOTAL, T3FREE, THYROIDAB in the last 72 hours.  Invalid input(s): FREET3 Anemia work up No results for input(s): VITAMINB12, FOLATE, FERRITIN, TIBC, IRON, RETICCTPCT in the last 72 hours. Urinalysis    Component Value Date/Time   COLORURINE YELLOW 08/07/2016 1500   APPEARANCEUR CLEAR 08/07/2016 1500   LABSPEC 1.016 08/07/2016 1500   PHURINE 6.0 08/07/2016 1500   GLUCOSEU NEGATIVE 08/07/2016 1500   HGBUR NEGATIVE 08/07/2016 1500   BILIRUBINUR NEGATIVE 08/07/2016 1500   KETONESUR NEGATIVE 08/07/2016 1500   PROTEINUR NEGATIVE 08/07/2016 1500   NITRITE NEGATIVE 08/07/2016 1500   LEUKOCYTESUR TRACE (A) 08/07/2016 1500   Sepsis Labs Invalid input(s): PROCALCITONIN,  WBC,  LACTICIDVEN Microbiology No results found for this or any previous visit (from the past 240 hour(s)).   Time coordinating discharge: 45 minutes       SIGNED:   Edwin Dada, MD  [facility] 01/29/2018, 5:43 PM

## 2018-01-29 NOTE — Plan of Care (Signed)
  Problem: Clinical Measurements: Goal: Cardiovascular complication will be avoided Outcome: Progressing   Problem: Spiritual Needs Goal: Ability to function at adequate level Outcome: Progressing

## 2018-02-02 NOTE — Progress Notes (Deleted)
Cardiology Office Note   Date:  02/02/2018   ID:  Korinne Greenstein, DOB Dec 09, 1960, MRN [id number]  PCP:  [facility], [facility]  Cardiologist: Dr.Branch No chief complaint on file.    History of Present Illness: Crystal Lowe is a 57 y.o. female who presents for ongoing assessment and management of CAD after admission for chest pain.ther history includes hypertension, hypercholesterolemia, and prior MI.  She was seen by Dr. Harl Bowie on consultation who documented that this patient was seen by cardiologist in [place] who has planned a cath but she moved to [place] before this occurred. She was found to be hypokalemic but had negative troponin. She was ruled out for ACS.   She was schedule for a cardiac CT 01/29/2018  IMPRESSION: 1. Coronary calcium score of 0. This was 0 percentile for age and sex matched control.  2. Normal coronary origin with right dominance.  3. Minimal CAD in the proximal LAD, otherwise normal coronaries.    Past Medical History:  Diagnosis Date  . High cholesterol   . Hypertension   . MI (myocardial infarction) [facility])     Past Surgical History:  Procedure Laterality Date  . ABDOMINAL HYSTERECTOMY    . CARDIAC SURGERY       Current Outpatient Medications  Medication Sig Dispense Refill  . acetaminophen (TYLENOL) 325 MG tablet Take 650 mg by mouth every 6 (six) hours as needed.    Marland Kitchen amLODipine (NORVASC) 5 MG tablet Take 5 mg by mouth daily as needed (high blood pressure).    . diazepam (VALIUM) 5 MG tablet Take 1 tablet (5 mg total) by mouth every 8 (eight) hours as needed for anxiety. 15 tablet 0  . gabapentin (NEURONTIN) 100 MG capsule Take 400 mg by mouth at bedtime.     Marland Kitchen losartan (COZAAR) 50 MG tablet Take 100 mg by mouth daily.    . meclizine (ANTIVERT) 25 MG tablet Take 1 tablet (25 mg total) by mouth 3 (three) times daily as needed for dizziness. 30 tablet 0  . metoprolol succinate (TOPROL-XL) 50 MG 24 hr tablet Take 100  mg by mouth daily. Take with or immediately following a meal.    . ondansetron (ZOFRAN) 4 MG tablet Take 1 tablet (4 mg total) by mouth daily as needed for nausea or vomiting. 30 tablet 1  . ranitidine (ZANTAC) 150 MG tablet Take 300 mg by mouth at bedtime.    . traZODone (DESYREL) 50 MG tablet Take 50 mg by mouth at bedtime.     No current facility-administered medications for this visit.     Allergies:   Aspirin    Social History:  The patient  reports that she has never smoked. She has never used smokeless tobacco. She reports that she does not drink alcohol or use drugs.   Family History:  The patient's family history includes Asthma in her father; Diabetes in her father; Hypertension in her father and mother; Kidney failure in her father.    ROS: All other systems are reviewed and negative. Unless otherwise mentioned in H&P    PHYSICAL EXAM: VS:  There were no vitals taken for this visit. , BMI There is no height or weight on file to calculate BMI. GEN: Well nourished, well developed, in no acute distress  HEENT: normal  Neck: no JVD, carotid bruits, or masses Cardiac: ***RRR; no murmurs, rubs, or gallops,no edema  Respiratory:  clear to auscultation bilaterally, normal work of breathing GI: soft, nontender, nondistended, +  BS MS: no deformity or atrophy  Skin: warm and dry, no rash Neuro:  Strength and sensation are intact Psych: euthymic mood, full affect   EKG:  EKG {ACTION; IS/IS [id number]} ordered today. The ekg ordered today demonstrates ***   Recent Labs: 01/27/2018: BUN 16; Creatinine, Ser 0.87; Hemoglobin 14.7; Platelets 207; Sodium 135 01/28/2018: ALT 29; Magnesium 2.0; Potassium 3.3    Lipid Panel    Component Value Date/Time   CHOL 181 01/29/2018 0520   TRIG 219 (H) 01/29/2018 0520   HDL 38 (L) 01/29/2018 0520   CHOLHDL 4.8 01/29/2018 0520   VLDL 44 (H) 01/29/2018 0520   LDLCALC 99 01/29/2018 0520      Wt Readings from Last 3 Encounters:    01/29/18 232 lb 12.9 oz (105.6 kg)  08/07/16 231 lb (104.8 kg)      Other studies Reviewed: Additional studies/ records that were reviewed today include: ***. Review of the above records demonstrates: ***   ASSESSMENT AND PLAN:  1.  ***   Current medicines are reviewed at length with the patient today.    Labs/ tests ordered today include: *** Phill Myron. West Pugh, ANP, AACC   02/02/2018 1:19 PM    [facility] [address], [place], [place] [address] Phone: [phone]; Fax: [phone]

## 2018-02-03 ENCOUNTER — Telehealth: Payer: Self-pay | Admitting: Adult Health

## 2018-02-03 ENCOUNTER — Ambulatory Visit: Payer: Medicaid Other | Admitting: Adult Health

## 2018-02-04 ENCOUNTER — Encounter: Payer: Self-pay | Admitting: *Deleted

## 2018-02-22 ENCOUNTER — Other Ambulatory Visit: Payer: Self-pay | Admitting: Family Medicine

## 2018-02-22 DIAGNOSIS — Z1231 Encounter for screening mammogram for malignant neoplasm of breast: Secondary | ICD-10-CM

## 2018-03-11 ENCOUNTER — Telehealth: Payer: Self-pay | Admitting: *Deleted

## 2018-03-11 NOTE — Telephone Encounter (Signed)
REFERRAL SENT TO SCHEDULING FOR 2ND TIME, PT NO SHOW FOR 1ST APPOINTMENT, NOTES ON FILE.

## 2018-03-16 ENCOUNTER — Ambulatory Visit
Admission: RE | Admit: 2018-03-16 | Discharge: 2018-03-16 | Disposition: A | Discharge status: Home / Self Care | Payer: Medicaid Other | Source: Ambulatory Visit | Attending: Family Medicine | Admitting: Family Medicine

## 2018-03-16 DIAGNOSIS — Z1231 Encounter for screening mammogram for malignant neoplasm of breast: Secondary | ICD-10-CM

## 2018-03-31 NOTE — Progress Notes (Signed)
Cardiology Office Note   Date:  04/01/2018   ID:  Avalee Castrellon, DOB [birth year], MRN [id number]  PCP:  Dierdre Harness, FNP  Cardiologist: Dr.Branch Chief Complaint  Patient presents with  . Chest Pain  . Hospitalization Follow-up     History of Present Illness: Crystal Lowe is a 57 y.o. female who presents for post hospital follow up after admission on 01/27/2018 for chest pain, with known history of hypertension, hyperlipidemia, and CAD (recently moved from [place] in April of 2019).  While living in [place] she was seen by cardiologist, and was planned for re-cath, but this was not completed before she moved to [place].   She was planned for echo which revealed normal EF. Coronary CTA revealed a calcium score of 0, minimal CAD in the proximal LAD otherwise normal coronary arteries. She was discharged on 01/29/2018 with no further cardiac work up recommended. HCTZ was restarted on discharge for hypertension.  She comes today without cardiac complaints She is very sedentary.   Past Medical History:  Diagnosis Date  . High cholesterol   . Hypertension   . MI (myocardial infarction) [facility])     Past Surgical History:  Procedure Laterality Date  . ABDOMINAL HYSTERECTOMY    . CARDIAC SURGERY       Current Outpatient Medications  Medication Sig Dispense Refill  . acetaminophen (TYLENOL) 325 MG tablet Take 650 mg by mouth every 6 (six) hours as needed.    Marland Kitchen amLODipine (NORVASC) 5 MG tablet Take 5 mg by mouth daily as needed (high blood pressure).    . diazepam (VALIUM) 5 MG tablet Take 1 tablet (5 mg total) by mouth every 8 (eight) hours as needed for anxiety. 15 tablet 0  . gabapentin (NEURONTIN) 100 MG capsule Take 400 mg by mouth at bedtime.     Marland Kitchen losartan (COZAAR) 50 MG tablet Take 100 mg by mouth daily.    . meclizine (ANTIVERT) 25 MG tablet Take 1 tablet (25 mg total) by mouth 3 (three) times daily as needed for dizziness. 30 tablet 0  . metoprolol succinate  (TOPROL-XL) 50 MG 24 hr tablet Take 100 mg by mouth daily. Take with or immediately following a meal.    . ondansetron (ZOFRAN) 4 MG tablet Take 1 tablet (4 mg total) by mouth daily as needed for nausea or vomiting. 30 tablet 1  . ranitidine (ZANTAC) 150 MG tablet Take 300 mg by mouth at bedtime.    . traZODone (DESYREL) 50 MG tablet Take 50 mg by mouth at bedtime.     No current facility-administered medications for this visit.     Allergies:   Aspirin    Social History:  The patient  reports that she has never smoked. She has never used smokeless tobacco. She reports that she does not drink alcohol or use drugs.   Family History:  The patient's family history includes Asthma in her father; Diabetes in her father; Hypertension in her father and mother; Kidney failure in her father.    ROS: All other systems are reviewed and negative. Unless otherwise mentioned in H&P    PHYSICAL EXAM: VS:  BP 120/80   Pulse (!) 54   Ht 5\' 6"  (1.676 m)   Wt 238 lb 6.4 oz (108.1 kg)   BMI 38.48 kg/m  , BMI Body mass index is 38.48 kg/m. GEN: Well nourished, well developed, in no acute distress  HEENT: normal  Neck: no JVD, carotid bruits, or masses Cardiac: RRR;  bradycardia, no murmurs, rubs, or gallops,no edema  Respiratory:  clear to auscultation bilaterally, normal work of breathing GI: soft, nontender, nondistended, + BS MS: no deformity or atrophy  Skin: warm and dry, no rash Neuro:  Strength and sensation are intact Psych: euthymic mood, full affect   EKG:  Sinus bradycardia. Rate of 54 bpm.   Recent Labs: 01/27/2018: BUN 16; Creatinine, Ser 0.87; Hemoglobin 14.7; Platelets 207; Sodium 135 February 16, 2018: ALT 29; Magnesium 2.0; Potassium 3.3    Lipid Panel    Component Value Date/Time   CHOL 181 01/29/2018 0520   TRIG 219 (H) 01/29/2018 0520   HDL 38 (L) 01/29/2018 0520   CHOLHDL 4.8 01/29/2018 0520   VLDL 44 (H) 01/29/2018 0520   LDLCALC 99 01/29/2018 0520      Wt Readings from  Last 3 Encounters:  04/01/18 238 lb 6.4 oz (108.1 kg)  01/29/18 232 lb 12.9 oz (105.6 kg)  08/07/16 231 lb (104.8 kg)      Other studies Reviewed: Echocardiogram 16-Feb-2018 Left ventricle: The cavity size was normal. Wall thickness was   normal. Systolic function was normal. The estimated ejection   fraction was in the range of 60% to 65%. Wall motion was normal;   there were no regional wall motion abnormalities. Doppler   parameters are consistent with abnormal left ventricular   relaxation (grade 1 diastolic dysfunction). - Aortic valve: Valve area (VTI): 2.42 cm^2. Valve area (Vmax):   2.15 cm^2. Valve area (Vmean): 2.11 cm^2. - Left atrium: The atrium was mildly dilated. - Technically adequate study.   ASSESSMENT AND PLAN:  1. Atypical chest pain: Recent hospitalization for this and was ruled out for ACS. She will not require further testing at this time.   2. Hypertension: Well controlled currently. No changes are needed in regimen.   3. Obesity: I have advised that she begin a walking program to increase activity and calorie consumption:    Current medicines are reviewed at length with the patient today.    Labs/ tests ordered today include:None   Phill Myron. West Pugh, ANP, AACC   04/01/2018 12:36 PM    [facility] [address], [place], [place] [address] Phone: [phone]; Fax: [phone]

## 2018-04-01 ENCOUNTER — Encounter: Payer: Self-pay | Admitting: Adult Health

## 2018-04-01 ENCOUNTER — Ambulatory Visit (INDEPENDENT_AMBULATORY_CARE_PROVIDER_SITE_OTHER): Payer: Medicaid Other | Admitting: Adult Health

## 2018-04-01 VITALS — BP 120/80 | HR 54 | Ht 66.0 in | Wt 238.4 lb

## 2018-04-01 DIAGNOSIS — I10 Essential (primary) hypertension: Secondary | ICD-10-CM | POA: Diagnosis not present

## 2018-04-01 NOTE — Patient Instructions (Signed)
Medication Instructions:  Your physician recommends that you continue on your current medications as directed. Please refer to the Current Medication list given to you today.   Labwork: None ordered  Testing/Procedures: None ordered  Follow-Up: Your physician wants you to follow-up in: 1 year with Crystal Lowe You will receive a reminder letter in the mail two months in advance. If you don't receive a letter, please call our office to schedule the follow-up appointment.   Any Other Special Instructions Will Be Listed Below (If Applicable). Your physician discussed the importance of regular exercise and recommended that you start or continue a regular exercise program for good health. Walking daily would be great!       If you need a refill on your cardiac medications before your next appointment, please call your pharmacy.

## 2018-08-17 ENCOUNTER — Other Ambulatory Visit: Payer: Self-pay | Admitting: Family Medicine

## 2018-08-17 ENCOUNTER — Ambulatory Visit
Admission: RE | Admit: 2018-08-17 | Discharge: 2018-08-17 | Disposition: A | Discharge status: Home / Self Care | Payer: Medicaid Other | Source: Ambulatory Visit | Attending: Family Medicine | Admitting: Family Medicine

## 2018-08-17 DIAGNOSIS — G8929 Other chronic pain: Secondary | ICD-10-CM

## 2018-08-17 DIAGNOSIS — M25561 Pain in right knee: Principal | ICD-10-CM

## 2018-08-17 DIAGNOSIS — W19XXXD Unspecified fall, subsequent encounter: Secondary | ICD-10-CM

## 2018-08-17 DIAGNOSIS — M25562 Pain in left knee: Principal | ICD-10-CM

## 2018-09-12 ENCOUNTER — Other Ambulatory Visit: Payer: Self-pay

## 2018-09-12 ENCOUNTER — Emergency Department (HOSPITAL_COMMUNITY)
Admission: EM | Admit: 2018-09-12 | Discharge: 2018-09-12 | Disposition: A | Discharge status: Home / Self Care | Payer: Medicaid Other | Attending: Emergency Medicine | Admitting: Emergency Medicine

## 2018-09-12 DIAGNOSIS — Z79899 Other long term (current) drug therapy: Secondary | ICD-10-CM | POA: Diagnosis not present

## 2018-09-12 DIAGNOSIS — I10 Essential (primary) hypertension: Secondary | ICD-10-CM | POA: Insufficient documentation

## 2018-09-12 DIAGNOSIS — R42 Dizziness and giddiness: Secondary | ICD-10-CM | POA: Diagnosis present

## 2018-09-12 MED ORDER — DIAZEPAM 10 MG PO TABS: 10.0000 mg | ORAL_TABLET | Freq: Three times a day (TID) | ORAL | 0 refills | Status: AC | PRN

## 2018-09-12 MED ORDER — DIAZEPAM 5 MG PO TABS
10.0000 mg | ORAL_TABLET | Freq: Once | ORAL | Status: AC
  Administered 2018-09-12: 10 mg via ORAL
  Filled 2018-09-12: qty 2

## 2018-09-12 NOTE — Discharge Instructions (Addendum)
You were seen in the ER for dizziness.  Your symptoms and physical exam suggest vertigo.  This is an inner ear issue.  Take Valium as prescribed.  Valium helps with nausea, spinning sensation but it can cause drowsiness.  Rest, lay down and avoid any sudden head movements.  Slowly resume activities when you start to feel better.  Caution with driving.  Be careful and move slowly to avoid a fall.   Follow up with ear, nose, throat doctor in 1 week for further discussion of your chronic symptoms.   Return to the ER You should seek help immediately if you have dizziness or vertigo along with any of the following: New or severe headache Fever higher than 100.21F (38C) Seeing double or having trouble seeing clearly Trouble speaking or hearing Weakness in your arm or leg or face An inability to walk without assistance Passing out Numbness or tingling Chest pain Vomiting that will not stop Palpitations, chest pain, shortness of breath

## 2018-09-12 NOTE — ED Triage Notes (Signed)
Arrival via EMS ref vertigo.  Pt has meclizine at home she normally takes, however did not take it tonight with tonights sx.  Pt denies pain and s/o N&V x2 pre-EMS arrival.  Pt states dizziness woke her up this a.m.

## 2018-09-12 NOTE — ED Notes (Signed)
Attempted to ambulate pt, however pt stated she was feeling dizzy just from the laying to sitting position. Pt believed it was from the valium that she was given and stated it felt like the room was still but she was spinning. Pt did not feel comfortable standing or walking at this time.

## 2018-09-12 NOTE — ED Provider Notes (Signed)
[facility] EMERGENCY DEPARTMENT Provider Note   CSN: [id number] Arrival date & time: 09/12/18  0539   History   Chief Complaint Chief Complaint  Patient presents with  . Dizziness    HPI Crystal Lowe is a 58 y.o. female with history of CAD, hypertension, hyperlipidemia, obesity, vertigo is here for evaluation of dizziness.  Sudden onset when she got up from the couch around 12:30 AM this morning.  Describes the dizziness as things rotating, brief associated with photosensitivity, generalized weakness, nausea and nonbloody nonbilious emesis x2.  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  The tinnitus and dizziness began at the same time many years ago.  This appears to be a chronic issue.  States she has dizziness episodes almost daily but rarely this severe.  She took her blood pressure medicines and meclizine this morning but threw up.  Her symptoms are worse with movements, head rotation specifically to the right.  They are better if she keeps her eyes closed.  She feels off balance when she opens her eyes and things start moving.   Denies associated headache, palpitations, chest pain, shortness of breath, cough, abdominal pain, dysarthria, dysphasia, diplopia, facial asymmetry, difficulty swallowing , unilateral loss of strength or sensation.  No head trauma. No recent URI or ear infections.  HPI  Past Medical History:  Diagnosis Date  . High cholesterol   . Hypertension   . MI (myocardial infarction) [facility])     Patient Active Problem List   Diagnosis Date Noted  . Chest pain 01/28/2018  . Hypokalemia 01/28/2018  . Essential hypertension 01/28/2018    Past Surgical History:  Procedure Laterality Date  . ABDOMINAL HYSTERECTOMY    . CARDIAC SURGERY       OB History   No obstetric history on file.      Home Medications    Prior to Admission medications   Medication Sig Start  Date End Date Taking? Authorizing Provider  acetaminophen (TYLENOL) 325 MG tablet Take 650 mg by mouth every 6 (six) hours as needed.    [provider]  amLODipine (NORVASC) 5 MG tablet Take 5 mg by mouth daily as needed (high blood pressure).    [provider]  diazepam (VALIUM) 10 MG tablet Take 1 tablet (10 mg total) by mouth every 8 (eight) hours as needed for up to 5 days (dizziness, vertigo). 09/12/18 09/17/18  Kinnie Feil, PA-C  gabapentin (NEURONTIN) 100 MG capsule Take 400 mg by mouth at bedtime.     [provider]  losartan (COZAAR) 50 MG tablet Take 100 mg by mouth daily.    [provider]  meclizine (ANTIVERT) 25 MG tablet Take 1 tablet (25 mg total) by mouth 3 (three) times daily as needed for dizziness. 08/07/16   Leo Grosser, MD  metoprolol succinate (TOPROL-XL) 50 MG 24 hr tablet Take 100 mg by mouth daily. Take with or immediately following a meal.    [provider]  ondansetron (ZOFRAN) 4 MG tablet Take 1 tablet (4 mg total) by mouth daily as needed for nausea or vomiting. 01/29/18 01/29/19  Danford, Suann Larry, MD  ranitidine (ZANTAC) 150 MG tablet Take 300 mg by mouth at bedtime.    [provider]  traZODone (DESYREL) 50 MG tablet Take 50 mg by mouth at bedtime.    [provider]    Family History Family History  Problem  Relation Age of Onset  . Hypertension Mother   . Hypertension Father   . Diabetes Father   . Asthma Father   . Kidney failure Father     Social History Social History   Tobacco Use  . Smoking status: Never Smoker  . Smokeless tobacco: Never Used  Substance Use Topics  . Alcohol use: No  . Drug use: No     Allergies   Aspirin   Review of Systems Review of Systems  HENT: Positive for tinnitus.   Gastrointestinal: Positive for nausea and vomiting.  Neurological: Positive for dizziness.  All other systems reviewed and are negative.    Physical Exam Updated Vital  Signs BP 140/78 (BP Location: Left Arm)   Pulse (!) 53   Temp (!) 97.4 F (36.3 C) (Oral)   Resp 16   Ht 5\' 6"  (1.676 m)   Wt 119.7 kg   SpO2 100%   BMI 42.61 kg/m   Physical Exam Constitutional:      Appearance: She is well-developed.     Comments: NAD. Non toxic. Keeps eyes closed during exam.  HENT:     Head: Normocephalic and atraumatic.     Right Ear: External ear normal.     Left Ear: External ear normal.     Nose: Nose normal.  Eyes:     Conjunctiva/sclera: Conjunctivae normal.  Neck:     Musculoskeletal: Normal range of motion.  Cardiovascular:     Rate and Rhythm: Normal rate and regular rhythm.     Heart sounds: Normal heart sounds.     Comments: 2+ DP and radial pulses bilaterally. No LE edema or calf tenderness. No hypotension or tachycardia.  Pulmonary:     Effort: Pulmonary effort is normal.     Breath sounds: Normal breath sounds.  Abdominal:     Palpations: Abdomen is soft.     Tenderness: There is no abdominal tenderness.  Musculoskeletal: Normal range of motion.  Skin:    General: Skin is warm and dry.     Capillary Refill: Capillary refill takes less than 2 seconds.  Neurological:     Comments: Alert and oriented to self, place, time and event.  Speech is fluent without obvious dysarthria or dysphasia. Strength 5/5 with hand grip and ankle F/E.   Sensation to light touch intact in face, hands and feet. Sits on side of the bed without truncal sway. No pronator drift. No leg drop.  Normal finger-to-nose and feet tapping.  CN I not tested CN II grossly intact visual fields bilaterally. Unable to visualize posterior eye. CN III, IV, VI PEERL and EOMs intact bilaterally CN V light touch intact in all 3 divisions of trigeminal nerve CN VII facial nerve movements symmetric, bilaterally CN VIII not tested CN IX, X no uvula deviation, symmetric rise of soft palate CN XI 5/5 SCM and trapezius strength bilaterally  CN XII Tongue protrusion midline,  symmetric L/R movement  HiNTs Exam Head impulse: saccade correction back midline noted Nystagmus: horizontal nystagmus Test of skey: normal vertical eye alignment without deviation  Psychiatric:        Behavior: Behavior normal.      ED Treatments / Results  Labs (all labs ordered are listed, but only abnormal results are displayed) Labs Reviewed - No data to display  EKG None  Radiology No results found.  Procedures Procedures (including critical care time)  Medications Ordered in ED Medications  diazepam (VALIUM) tablet 10 mg (10 mg Oral Given 09/12/18 6333)  Initial Impression / Assessment and Plan / ED Course  I have reviewed the triage vital signs and the nursing notes.  Pertinent labs & imaging results that were available during my care of the patient were reviewed by me and considered in my medical decision making (see chart for details).  Clinical Course as of Sep 12 812  Sun Sep 12, 2018  0746 I saw EMT note.  I personally evaluated and ambulated patient.  Reports moderate improvement in dizziness after Valium.  No episodes of emesis.  I ambulated patient in the room.  She had slow, cautious but steady gait.  Reported slight dizziness but reports improvement.     [CG]    Clinical Course User Index [CG] Kinnie Feil, PA-C   58 year old is here with intermittent episodes of dizziness.  Symptoms have been chronic for several years.  Associated with nausea, difficulty walking from spinning sensation, vomiting.  Was unable to tolerate meclizine prior to arrival.  She denies any concerning symptoms such as focal weakness or sensory deficits, headache, LOC, chest pain, shortness of breath, palpitations.  No new medications.  On exam she has a normal neurological exam.  Hints exam is reassuring and most suggestive of peripheral vertigo.  Her symptoms are worsened with right head rotation.  She has no diplopia, dysarthria, dysphagia, dysmetria on exam.  Patient  was seen in the ER 2 to 3 years ago with pretty similar symptoms including dizziness is worse with right head rotation.  At that time she had an MRI which was negative.  Additionally she has had right tinnitus which supports peripheral etiology.  She was given Valium and I personally ambulated patient who felt moderate improvement in her symptoms.  Repeat cerebellar neuro exam is reassuring again.  Given chronicity of symptoms, reassuring exam, improvement with medicines I do not think there is emergent need for lab work, imaging today.  I doubt central cause of symptoms like cerebellar stroke, hemorrhage.  She has no cardiac symptoms to suggest cardiac etiology.  Blood pressure is normal.  Discussed plan to discharge and patient is comfortable with this.  We will give prescription for Valium.  It seems her symptoms are almost daily.  I will give ENT follow-up as well.  Return precautions were discussed.  Patient is comfortable with this plan.   Final Clinical Impressions(s) / ED Diagnoses   Final diagnoses:  Dizziness    ED Discharge Orders         Ordered    diazepam (VALIUM) 10 MG tablet  Every 8 hours PRN     09/12/18 0752           Kinnie Feil, PA-C 09/12/18 7782    Orpah Greek, MD 09/13/18 [phone]

## 2019-01-10 ENCOUNTER — Other Ambulatory Visit: Payer: Self-pay | Admitting: Otolaryngology

## 2019-01-10 DIAGNOSIS — H918X1 Other specified hearing loss, right ear: Secondary | ICD-10-CM

## 2019-01-10 DIAGNOSIS — H93A1 Pulsatile tinnitus, right ear: Secondary | ICD-10-CM

## 2019-01-10 DIAGNOSIS — IMO0001 Reserved for inherently not codable concepts without codable children: Secondary | ICD-10-CM

## 2019-01-10 DIAGNOSIS — R42 Dizziness and giddiness: Secondary | ICD-10-CM

## 2019-01-11 ENCOUNTER — Emergency Department (HOSPITAL_COMMUNITY)
Admission: EM | Admit: 2019-01-11 | Discharge: 2019-01-11 | Disposition: A | Discharge status: Home / Self Care | Payer: Medicaid Other | Attending: Emergency Medicine | Admitting: Emergency Medicine

## 2019-01-11 ENCOUNTER — Other Ambulatory Visit: Payer: Self-pay

## 2019-01-11 ENCOUNTER — Encounter (HOSPITAL_COMMUNITY): Payer: Self-pay | Admitting: Emergency Medicine

## 2019-01-11 DIAGNOSIS — Z79899 Other long term (current) drug therapy: Secondary | ICD-10-CM | POA: Diagnosis not present

## 2019-01-11 DIAGNOSIS — I10 Essential (primary) hypertension: Secondary | ICD-10-CM | POA: Diagnosis not present

## 2019-01-11 DIAGNOSIS — J069 Acute upper respiratory infection, unspecified: Secondary | ICD-10-CM

## 2019-01-11 DIAGNOSIS — Z20828 Contact with and (suspected) exposure to other viral communicable diseases: Secondary | ICD-10-CM | POA: Insufficient documentation

## 2019-01-11 DIAGNOSIS — I252 Old myocardial infarction: Secondary | ICD-10-CM | POA: Diagnosis not present

## 2019-01-11 DIAGNOSIS — J029 Acute pharyngitis, unspecified: Secondary | ICD-10-CM | POA: Insufficient documentation

## 2019-01-11 DIAGNOSIS — R509 Fever, unspecified: Secondary | ICD-10-CM | POA: Diagnosis present

## 2019-01-11 NOTE — ED Provider Notes (Signed)
[facility] EMERGENCY DEPARTMENT Provider Note   CSN: [id number] Arrival date & time: 01/11/19  1430    History   Chief Complaint Chief Complaint  Patient presents with  . Sore Throat  . Fever    HPI Crystal Lowe is a 58 y.o. female.     HPI   58 year old female with history of hypertension presents today with complaints of upper respiratory symptoms.  Patient notes a very minor sore throat, decreased taste.  She denies any fever or cough.  She denies any shortness of breath.  She notes that her brother tested positive for coronavirus, she notes she does not live with him but is and around him frequently.  Requesting COVID testing.  Past Medical History:  Diagnosis Date  . High cholesterol   . Hypertension   . MI (myocardial infarction) [facility])     Patient Active Problem List   Diagnosis Date Noted  . Chest pain 01/28/2018  . Hypokalemia 01/28/2018  . Essential hypertension 01/28/2018    Past Surgical History:  Procedure Laterality Date  . ABDOMINAL HYSTERECTOMY    . CARDIAC SURGERY       OB History   No obstetric history on file.      Home Medications    Prior to Admission medications   Medication Sig Start Date End Date Taking? Authorizing Provider  acetaminophen (TYLENOL) 325 MG tablet Take 650 mg by mouth every 6 (six) hours as needed.   Yes [provider]  amLODipine (NORVASC) 5 MG tablet Take 5 mg by mouth daily as needed (high blood pressure).   Yes [provider]  gabapentin (NEURONTIN) 100 MG capsule Take 400 mg by mouth at bedtime.    Yes [provider]  hydrochlorothiazide (HYDRODIURIL) 25 MG tablet Take 25 mg by mouth daily.   Yes [provider]  losartan (COZAAR) 50 MG tablet Take 100 mg by mouth daily.   Yes [provider]  meclizine (ANTIVERT) 25 MG tablet Take 1 tablet (25 mg total) by mouth 3 (three) times daily as needed for dizziness. 08/07/16  Yes Leo Grosser,  MD  metoprolol succinate (TOPROL-XL) 50 MG 24 hr tablet Take 100 mg by mouth daily. Take with or immediately following a meal.   Yes [provider]  traZODone (DESYREL) 50 MG tablet Take 50 mg by mouth at bedtime.   Yes [provider]    Family History Family History  Problem Relation Age of Onset  . Hypertension Mother   . Hypertension Father   . Diabetes Father   . Asthma Father   . Kidney failure Father     Social History Social History   Tobacco Use  . Smoking status: Never Smoker  . Smokeless tobacco: Never Used  Substance Use Topics  . Alcohol use: No  . Drug use: No     Allergies   Aspirin   Review of Systems Review of Systems  All other systems reviewed and are negative.   Physical Exam Updated Vital Signs BP (!) 120/91 (BP Location: Right Arm)   Pulse 62   Temp 99.1 F (37.3 C) (Oral)   Resp 16   Ht 5\' 5"  (1.651 m)   Wt 111.6 kg   SpO2 100%   BMI 40.94 kg/m   Physical Exam Vitals signs and nursing note reviewed.  Constitutional:      Appearance: She is well-developed.  HENT:     Head: Normocephalic and atraumatic.     Comments: Oropharynx  clear with no swelling erythema or edema Eyes:     General: No scleral icterus.       Right eye: No discharge.        Left eye: No discharge.     Conjunctiva/sclera: Conjunctivae normal.     Pupils: Pupils are equal, round, and reactive to light.  Neck:     Musculoskeletal: Normal range of motion.     Vascular: No JVD.     Trachea: No tracheal deviation.  Pulmonary:     Effort: Pulmonary effort is normal. No respiratory distress.     Breath sounds: Normal breath sounds. No stridor. No wheezing, rhonchi or rales.  Neurological:     Mental Status: She is alert and oriented to person, place, and time.     Coordination: Coordination normal.  Psychiatric:        Behavior: Behavior normal.        Thought Content: Thought content normal.        Judgment: Judgment normal.      ED  Treatments / Results  Labs (all labs ordered are listed, but only abnormal results are displayed) Labs Reviewed  NOVEL CORONAVIRUS, NAA (HOSPITAL ORDER, SEND-OUT TO REF LAB)    EKG None  Radiology No results found.  Procedures Procedures (including critical care time)  Medications Ordered in ED Medications - No data to display   Initial Impression / Assessment and Plan / ED Course  I have reviewed the triage vital signs and the nursing notes.  Pertinent labs & imaging results that were available during my care of the patient were reviewed by me and considered in my medical decision making (see chart for details).       58 year old female presents today with complaints of upper respiratory infection.  She has no objective signs of infection.  Her vital signs are reassuring with no signs of infectious etiology.  Given her history of hypertension and close contacts she will have Cobra testing.  She will follow-up immediately if she develops any new or worsening signs or symptoms.  She verbalized understanding and agreement to today's plan had no further questions or concerns at the time of discharge.  Final Clinical Impressions(s) / ED Diagnoses   Final diagnoses:  Viral URI    ED Discharge Orders    None       Francee Gentile 01/11/19 2100    Davonna Belling, MD 01/12/19 2157

## 2019-01-11 NOTE — ED Triage Notes (Signed)
Pt states he brother tested positive for covid today and she has a sore throat with decrease taste so wanted to get tested.

## 2019-01-11 NOTE — Discharge Instructions (Addendum)
Please read attached information. If you experience any new or worsening signs or symptoms please return to the emergency room for evaluation. Please follow-up with your primary care provider or specialist as discussed.  °

## 2019-01-11 NOTE — ED Notes (Signed)
Patient verbalizes understanding of discharge instructions. Opportunity for questioning and answers were provided. Pt discharged from ED. 

## 2019-01-12 LAB — NOVEL CORONAVIRUS, NAA (HOSP ORDER, SEND-OUT TO REF LAB; TAT 18-24 HRS): SARS-CoV-2, NAA: NOT DETECTED

## 2019-01-14 ENCOUNTER — Telehealth (HOSPITAL_COMMUNITY): Payer: Self-pay

## 2019-01-25 ENCOUNTER — Ambulatory Visit
Admission: RE | Admit: 2019-01-25 | Discharge: 2019-01-25 | Disposition: A | Discharge status: Home / Self Care | Payer: Medicaid Other | Source: Ambulatory Visit | Attending: Otolaryngology | Admitting: Otolaryngology

## 2019-01-25 DIAGNOSIS — H918X1 Other specified hearing loss, right ear: Secondary | ICD-10-CM

## 2019-01-25 DIAGNOSIS — IMO0001 Reserved for inherently not codable concepts without codable children: Secondary | ICD-10-CM

## 2019-01-25 DIAGNOSIS — R42 Dizziness and giddiness: Secondary | ICD-10-CM

## 2019-01-25 DIAGNOSIS — H93A1 Pulsatile tinnitus, right ear: Secondary | ICD-10-CM

## 2019-01-25 MED ORDER — IOPAMIDOL (ISOVUE-370) INJECTION 76%
75.0000 mL | Freq: Once | INTRAVENOUS | Status: AC | PRN
  Administered 2019-01-25: 75 mL via INTRAVENOUS

## 2019-03-09 ENCOUNTER — Ambulatory Visit: Payer: Medicaid Other | Admitting: Cardiovascular Disease

## 2019-05-04 NOTE — Progress Notes (Deleted)
{Choose 1 Note Type (Telehealth Visit or Telephone Visit):[phone]}   Date:  05/04/2019   ID:  Crystal Lowe, DOB 13-Feb-1961, MRN [id number]  {Patient Location:[id number]::"Home"} {Provider Location:[phone]::"Home"}  PCP:  Ermalene Postin, NP  Cardiologist: Dr.Branch Electrophysiologist:  None   Evaluation Performed:  {Choose Visit [id number] Visit"}  Chief Complaint:  ***  History of Present Illness:    Crystal Lowe is a 58 y.o. female with history of hypertension, hyperlipidemia, and CAD (recently moved from [place] in April of 2019).  While living in [place] she was seen by cardiologist, and was planned for re-cath, but this was not completed before she moved to [place]. Coronary CTA revealed a calcium score of 0, minimal CAD in the proximal LAD otherwise normal coronary arteries. She was discharged on 01/29/2018 with no further cardiac work up recommended. HCTZ was restarted on discharge for hypertension  The patient {does/does not:200015} have symptoms concerning for COVID-19 infection (fever, chills, cough, or new shortness of breath).    Past Medical History:  Diagnosis Date  . High cholesterol   . Hypertension   . MI (myocardial infarction) [facility])    Past Surgical History:  Procedure Laterality Date  . ABDOMINAL HYSTERECTOMY    . CARDIAC SURGERY       No outpatient medications have been marked as taking for the 05/05/19 encounter (Appointment) with Lendon Colonel, NP.     Allergies:   Aspirin   Social History   Tobacco Use  . Smoking status: Never Smoker  . Smokeless tobacco: Never Used  Substance Use Topics  . Alcohol use: No  . Drug use: No     Family Hx: The patient's family history includes Asthma in her father; Diabetes in her father; Hypertension in her father and mother; Kidney failure in her father.  ROS:   Please see the history of present illness.    *** All other systems reviewed and are negative.   Prior CV studies:   The following studies were reviewed today: Other studies Reviewed: Echocardiogram 2018-02-23 Left ventricle: The cavity size was normal. Wall thickness was normal. Systolic function was normal. The estimated ejection fraction was in the range of 60% to 65%. Wall motion was normal; there were no regional wall motion abnormalities. Doppler parameters are consistent with abnormal left ventricular relaxation (grade 1 diastolic dysfunction). - Aortic valve: Valve area (VTI): 2.42 cm^2. Valve area (Vmax): 2.15 cm^2. Valve area (Vmean): 2.11 cm^2. - Left atrium: The atrium was mildly dilated. - Technically adequate study.  Labs/Other Tests and Data Reviewed:    EKG:  {EKG/Telemetry Strips Reviewed:[id number]}  Recent Labs: No results found for requested labs within last 8760 hours.   Recent Lipid Panel Lab Results  Component Value Date/Time   CHOL 181 01/29/2018 05:20 AM   TRIG 219 (H) 01/29/2018 05:20 AM   HDL 38 (L) 01/29/2018 05:20 AM   CHOLHDL 4.8 01/29/2018 05:20 AM   LDLCALC 99 01/29/2018 05:20 AM    Wt Readings from Last 3 Encounters:  01/11/19 246 lb (111.6 kg)  09/12/18 264 lb (119.7 kg)  04/01/18 238 lb 6.4 oz (108.1 kg)     Objective:    Vital Signs:  There were no vitals taken for this visit.   {[facility] Virtual Exam (Optional):[id number]::"VITAL SIGNS:  reviewed"}  ASSESSMENT & PLAN:    1. ***  COVID-19 Education: The signs and symptoms of COVID-19 were discussed with the patient and how to seek care for testing (follow up with PCP or arrange  E-visit).  ***The importance of social distancing was discussed today.  Time:   Today, I have spent *** minutes with the patient with telehealth technology discussing the above problems.     Medication Adjustments/Labs and Tests Ordered: Current medicines are reviewed at length with the patient today.  Concerns regarding medicines are outlined above.   Tests Ordered: No orders of  the defined types were placed in this encounter.   Medication Changes: No orders of the defined types were placed in this encounter.   Disposition:  Follow up {follow up:15908}  Signed, Phill Myron. West Pugh, ANP, AACC  05/04/2019 1:17 PM    [facility]

## 2019-05-05 ENCOUNTER — Telehealth: Payer: Medicaid Other | Admitting: Adult Health

## 2019-05-11 NOTE — Progress Notes (Unsigned)
{Choose 1 Note Type (Telehealth Visit or Telephone Visit):[phone]}   Date:  05/11/2019   ID:  Crystal Lowe, DOB 07/04/1961, MRN [id number]  {Patient [phone]::"Home"} {Provider [phone]::"Home"}  PCP:  Ermalene Postin, NP  Cardiologist: Dr. Harl Bowie Electrophysiologist:  None   Evaluation Performed:  {Choose Visit [phone]::"Follow-Up Visit"}  Chief Complaint:  ***  History of Present Illness:    Crystal Lowe is a 58 y.o. female for ongoing assessment and management of hypertension, CAD, hyperlipidemia.  Patient did have a coronary CTA which revealed a calcium score of 0 with minimal CAD in the proximal LAD otherwise normal coronary arteries.  She continues to have atypical chest pain symptoms.  On last office visit on 11/29/2017 blood pressure was well controlled.  She was advised on a walking program to assist with increased activity and calorie consumption and she was overweight.  The patient {does/does not:200015} have symptoms concerning for COVID-19 infection (fever, chills, cough, or new shortness of breath).    Past Medical History:  Diagnosis Date  . High cholesterol   . Hypertension   . MI (myocardial infarction) [facility])    Past Surgical History:  Procedure Laterality Date  . ABDOMINAL HYSTERECTOMY    . CARDIAC SURGERY       No outpatient medications have been marked as taking for the 05/12/19 encounter (Appointment) with Lendon Colonel, NP.     Allergies:   Aspirin   Social History   Tobacco Use  . Smoking status: Never Smoker  . Smokeless tobacco: Never Used  Substance Use Topics  . Alcohol use: No  . Drug use: No     Family Hx: The patient's family history includes Asthma in her father; Diabetes in her father; Hypertension in her father and mother; Kidney failure in her father.  ROS:   Please see the history of present illness.    *** All other systems reviewed and are negative.   Prior CV  studies:   The following studies were reviewed today:  Echocardiogram 01/28/2018 Left ventricle: The cavity size was normal. Wall thickness was   normal. Systolic function was normal. The estimated ejection   fraction was in the range of 60% to 65%. Wall motion was normal;   there were no regional wall motion abnormalities. Doppler   parameters are consistent with abnormal left ventricular   relaxation (grade 1 diastolic dysfunction). - Aortic valve: Valve area (VTI): 2.42 cm^2. Valve area (Vmax):   2.15 cm^2. Valve area (Vmean): 2.11 cm^2. - Left atrium: The atrium was mildly dilated. - Technically adequate study.  Coronary CTA 01/29/2018 1. Coronary calcium score of 0. This was 0 percentile for age and sex matched control.  2. Normal coronary origin with right dominance.  3. Minimal CAD in the proximal LAD, otherwise normal coronaries.  Labs/Other Tests and Data Reviewed:    EKG:  {EKG/Telemetry Strips Reviewed:[phone]}  Recent Labs: No results found for requested labs within last 8760 hours.   Recent Lipid Panel Lab Results  Component Value Date/Time   CHOL 181 01/29/2018 05:20 AM   TRIG 219 (H) 01/29/2018 05:20 AM   HDL 38 (L) 01/29/2018 05:20 AM   CHOLHDL 4.8 01/29/2018 05:20 AM   LDLCALC 99 01/29/2018 05:20 AM    Wt Readings from Last 3 Encounters:  01/11/19 246 lb (111.6 kg)  09/12/18 264 lb (119.7 kg)  04/01/18 238 lb 6.4 oz (108.1 kg)     Objective:    Vital Signs:  There were no vitals taken for  this visit.   {[facility] Virtual Exam (Optional):[phone]::"VITAL SIGNS:  reviewed"}  ASSESSMENT & PLAN:    1. ***  COVID-19 Education: The signs and symptoms of COVID-19 were discussed with the patient and how to seek care for testing (follow up with PCP or arrange E-visit).  ***The importance of social distancing was discussed today.  Time:   Today, I have spent *** minutes with the patient with telehealth technology discussing the above problems.      Medication Adjustments/Labs and Tests Ordered: Current medicines are reviewed at length with the patient today.  Concerns regarding medicines are outlined above.   Tests Ordered: No orders of the defined types were placed in this encounter.   Medication Changes: No orders of the defined types were placed in this encounter.   Disposition:  Follow up {follow up:15908}  Signed, Phill Myron. West Pugh, ANP, AACC  05/11/2019 1:12 PM    [facility]

## 2019-05-12 ENCOUNTER — Telehealth: Payer: Medicaid Other | Admitting: Adult Health

## 2019-05-30 ENCOUNTER — Other Ambulatory Visit: Payer: Self-pay

## 2019-05-30 DIAGNOSIS — Z20822 Contact with and (suspected) exposure to covid-19: Secondary | ICD-10-CM

## 2019-06-01 ENCOUNTER — Telehealth: Payer: Self-pay | Admitting: General Practice

## 2019-06-01 LAB — NOVEL CORONAVIRUS, NAA: SARS-CoV-2, NAA: NOT DETECTED

## 2019-06-01 NOTE — Telephone Encounter (Signed)
Negative COVID results given. Patient results "NOT Detected." Caller expressed understanding. ° °

## 2019-08-15 ENCOUNTER — Encounter (HOSPITAL_COMMUNITY): Payer: Self-pay

## 2019-08-15 ENCOUNTER — Other Ambulatory Visit: Payer: Self-pay

## 2019-08-15 ENCOUNTER — Ambulatory Visit (HOSPITAL_COMMUNITY)
Admission: EM | Admit: 2019-08-15 | Discharge: 2019-08-15 | Disposition: A | Discharge status: Home / Self Care | Payer: Medicaid Other | Attending: Family Medicine | Admitting: Family Medicine

## 2019-08-15 DIAGNOSIS — L97529 Non-pressure chronic ulcer of other part of left foot with unspecified severity: Secondary | ICD-10-CM

## 2019-08-15 MED ORDER — DOXYCYCLINE HYCLATE 100 MG PO CAPS: 100.0000 mg | ORAL_CAPSULE | Freq: Two times a day (BID) | ORAL | 0 refills | Status: AC

## 2019-08-15 NOTE — Discharge Instructions (Addendum)
Use a toe spacer as needed to prevent further pressure to this area from it's neighboring toe.  You may continue to soak the toe regularly to promote drainage.  Please follow up with podiatry, foot specialist, for further evaluation and management of this.  Complete course of antibiotics.

## 2019-08-15 NOTE — ED Provider Notes (Signed)
[facility]    CSN: [id number] Arrival date & time: 08/15/19  1145      History   Chief Complaint Chief Complaint  Patient presents with  . Foot Pain    Left big toe    HPI Crystal Lowe is a 59 y.o. female.   Crystal Lowe presents with complaints of lesion to her left great toe. Has had issues for the past year with this, over the past two days she was able to express clear gel appearing substance. Painful. Causes her entire toe to have pain redness and swelling. No fevers. No history of DM. States she was in the hospital due to this in the past, in [place], but nothing was specifically done for the toe, she is uncertain of the details. No previous surgical intervention. History  Of htn.     ROS per HPI, negative if not otherwise mentioned.      Past Medical History:  Diagnosis Date  . High cholesterol   . Hypertension   . MI (myocardial infarction) [facility])     Patient Active Problem List   Diagnosis Date Noted  . Chest pain 01/28/2018  . Hypokalemia 01/28/2018  . Essential hypertension 01/28/2018    Past Surgical History:  Procedure Laterality Date  . ABDOMINAL HYSTERECTOMY    . CARDIAC SURGERY      OB History   No obstetric history on file.      Home Medications    Prior to Admission medications   Medication Sig Start Date End Date Taking? Authorizing Provider  acetaminophen (TYLENOL) 325 MG tablet Take 650 mg by mouth every 6 (six) hours as needed.    [provider]  amLODipine (NORVASC) 5 MG tablet Take 5 mg by mouth daily as needed (high blood pressure).    [provider]  doxycycline (VIBRAMYCIN) 100 MG capsule Take 1 capsule (100 mg total) by mouth 2 (two) times daily. 08/15/19   Zigmund Gottron, NP  gabapentin (NEURONTIN) 100 MG capsule Take 400 mg by mouth at bedtime.     [provider]  hydrochlorothiazide (HYDRODIURIL) 25 MG tablet Take 25 mg by mouth daily.    [provider]  losartan (COZAAR) 50 MG tablet Take 100 mg by mouth daily.    [provider]  meclizine (ANTIVERT) 25 MG tablet Take 1 tablet (25 mg total) by mouth 3 (three) times daily as needed for dizziness. 08/07/16   Leo Grosser, MD  metoprolol succinate (TOPROL-XL) 50 MG 24 hr tablet Take 100 mg by mouth daily. Take with or immediately following a meal.    [provider]  traZODone (DESYREL) 50 MG tablet Take 50 mg by mouth at bedtime.    [provider]    Family History Family History  Problem Relation Age of Onset  . Hypertension Mother   . Hypertension Father   . Diabetes Father   . Asthma Father   . Kidney failure Father     Social History Social History   Tobacco Use  . Smoking status: Never Smoker  . Smokeless tobacco: Never Used  Substance Use Topics  . Alcohol use: No  . Drug use: No     Allergies   Aspirin   Review of Systems Review of Systems   Physical Exam Triage Vital Signs ED Triage Vitals  Enc Vitals Group     BP 08/15/19 1303 (!) 150/85     Pulse Rate 08/15/19 1303 65  Resp 08/15/19 1303 16     Temp 08/15/19 1303 98.3 F (36.8 C)     Temp Source 08/15/19 1303 Oral     SpO2 08/15/19 1303 98 %     Weight --      Height --      Head Circumference --      Peak Flow --      Pain Score 08/15/19 1302 4     Pain Loc --      Pain Edu? --      Excl. in [place]? --    No data found.  Updated Vital Signs BP (!) 150/85 (BP Location: Right Arm)   Pulse 65   Temp 98.3 F (36.8 C) (Oral)   Resp 16   SpO2 98%   Visual Acuity Right Eye Distance:   Left Eye Distance:   Bilateral Distance:    Right Eye Near:   Left Eye Near:    Bilateral Near:     Physical Exam Constitutional:      General: She is not in acute distress.    Appearance: She is well-developed.  Cardiovascular:     Rate and Rhythm: Normal rate.  Pulmonary:     Effort: Pulmonary effort is normal.  Musculoskeletal:       Feet:  Feet:      Comments: Circular lesion to left medial toe which is tender, open, approximately 5 mm in diameter; no active drainage and no drainage with application of light pressure ; redness to toe surrounding toe as well with tenderness; cap refill < 2 seconds ; see photo of lesion Skin:    General: Skin is warm and dry.  Neurological:     Mental Status: She is alert and oriented to person, place, and time.        UC Treatments / Results  Labs (all labs ordered are listed, but only abnormal results are displayed) Labs Reviewed - No data to display  EKG   Radiology No results found.  Procedures Procedures (including critical care time)  Medications Ordered in UC Medications - No data to display  Initial Impression / Assessment and Plan / UC Course  I have reviewed the triage vital signs and the nursing notes.  Pertinent labs & imaging results that were available during my care of the patient were reviewed by me and considered in my medical decision making (see chart for details).     Cyst vs callous vs ulcer vs abscess considered. With surrounding toe redness antibiotics provided. Unfortunately this has been an ongoing issue for patient, therefore recommend follow up with podiatry for definitive treatment plan. Patient verbalized understanding and agreeable to plan.  Patient verbalized understanding and agreeable to plan.  Ambulatory out of clinic without difficulty.    Final Clinical Impressions(s) / UC Diagnoses   Final diagnoses:  Ulcer of toe of left foot, unspecified ulcer stage [facility])     Discharge Instructions     Use a toe spacer as needed to prevent further pressure to this area from it's neighboring toe.  You may continue to soak the toe regularly to promote drainage.  Please follow up with podiatry, foot specialist, for further evaluation and management of this.  Complete course of antibiotics.     ED Prescriptions    Medication Sig Dispense Auth. Provider    doxycycline (VIBRAMYCIN) 100 MG capsule Take 1 capsule (100 mg total) by mouth 2 (two) times daily. 20 capsule Zigmund Gottron, NP     PDMP  not reviewed this encounter.   Zigmund Gottron, NP 08/15/19 [phone]

## 2019-08-15 NOTE — ED Triage Notes (Signed)
Pt present left foot/big toe pain, symptoms started on Friday  Night after she soak her foot some clear jelly substance came out.

## 2019-08-18 ENCOUNTER — Ambulatory Visit: Payer: Medicaid Other | Admitting: Podiatry

## 2019-08-18 ENCOUNTER — Other Ambulatory Visit: Payer: Self-pay

## 2019-08-18 ENCOUNTER — Ambulatory Visit (INDEPENDENT_AMBULATORY_CARE_PROVIDER_SITE_OTHER): Payer: Medicaid Other

## 2019-08-18 VITALS — Temp 98.5°F

## 2019-08-18 DIAGNOSIS — M216X2 Other acquired deformities of left foot: Secondary | ICD-10-CM | POA: Diagnosis not present

## 2019-08-18 DIAGNOSIS — M67472 Ganglion, left ankle and foot: Secondary | ICD-10-CM

## 2019-08-18 DIAGNOSIS — M79672 Pain in left foot: Secondary | ICD-10-CM

## 2019-08-18 DIAGNOSIS — M79675 Pain in left toe(s): Secondary | ICD-10-CM

## 2019-08-18 DIAGNOSIS — L97521 Non-pressure chronic ulcer of other part of left foot limited to breakdown of skin: Secondary | ICD-10-CM | POA: Diagnosis not present

## 2019-08-18 NOTE — Patient Instructions (Signed)
Pre-Operative Instructions  Congratulations, you have decided to take an important step towards improving your quality of life.  You can be assured that the doctors and staff at [facility] will be with you every step of the way.  Here are some important things you should know:  1. Plan to be at the surgery center/hospital at least 1 (one) hour prior to your scheduled time, unless otherwise directed by the surgical center/hospital staff.  You must have a responsible adult accompany you, remain during the surgery and drive you home.  Make sure you have directions to the surgical center/hospital to ensure you arrive on time. 2. If you are having surgery at [facility] or [facility], you will need a copy of your medical history and physical form from your family physician within one month prior to the date of surgery. We will give you a form for your primary physician to complete.  3. We make every effort to accommodate the date you request for surgery.  However, there are times where surgery dates or times have to be moved.  We will contact you as soon as possible if a change in schedule is required.   4. No aspirin/ibuprofen for one week before surgery.  If you are on aspirin, any non-steroidal anti-inflammatory medications (Mobic, Aleve, Ibuprofen) should not be taken seven (7) days prior to your surgery.  You make take Tylenol for pain prior to surgery.  5. Medications - If you are taking daily heart and blood pressure medications, seizure, reflux, allergy, asthma, anxiety, pain or diabetes medications, make sure you notify the surgery center/hospital before the day of surgery so they can tell you which medications you should take or avoid the day of surgery. 6. No food or drink after midnight the night before surgery unless directed otherwise by surgical center/hospital staff. 7. No alcoholic beverages 24-hours prior to surgery.  No smoking 24-hours prior or 24-hours after  surgery. 8. Wear loose pants or shorts. They should be loose enough to fit over bandages, boots, and casts. 9. Don't wear slip-on shoes. Sneakers are preferred. 10. Bring your boot with you to the surgery center/hospital.  Also bring crutches or a walker if your physician has prescribed it for you.  If you do not have this equipment, it will be provided for you after surgery. 11. If you have not been contacted by the surgery center/hospital by the day before your surgery, call to confirm the date and time of your surgery. 12. Leave-time from work may vary depending on the type of surgery you have.  Appropriate arrangements should be made prior to surgery with your employer. 13. Prescriptions will be provided immediately following surgery by your doctor.  Fill these as soon as possible after surgery and take the medication as directed. Pain medications will not be refilled on weekends and must be approved by the doctor. 14. Remove nail polish on the operative foot and avoid getting pedicures prior to surgery. 15. Wash the night before surgery.  The night before surgery wash the foot and leg well with water and the antibacterial soap provided. Be sure to pay special attention to beneath the toenails and in between the toes.  Wash for at least three (3) minutes. Rinse thoroughly with water and dry well with a towel.  Perform this wash unless told not to do so by your physician.  Enclosed: 1 Ice pack (please put in freezer the night before surgery)   1 Hibiclens skin cleaner     Pre-op instructions  If you have any questions regarding the instructions, please do not hesitate to call our office.  [place]: [address], [place], [place] [address] -- [phone]  [place]: [address] [place], [place] [address] -- [phone]  [place]: [address], [place], [place] [address] -- [phone]   Website: [email or web address] 

## 2019-08-21 NOTE — Progress Notes (Signed)
  Subjective:  Patient ID: Crystal Lowe, female    DOB: 02-21-1961,  MRN: [id number]  Chief Complaint  Patient presents with  . Toe Pain    Pt states long term wound/chronic infection 3 years duration on her left 1st digit. Pt states it feels like "Pain in my bone" since december 2020. Denies fever/chills/nausea/vomiting. Pt states previous hospitalization for 7 days one year ago where she was given IV antibiotics. Pt describes wound as "Like a pimple that grows and then I pop it when its big"    59 y.o. female presents with the above complaint. History confirmed with patient.   Objective:  Physical Exam: warm, good capillary refill, no trophic changes or ulcerative lesions, normal DP and PT pulses and normal sensory exam. Left Foot: lesion left hallux with ulceration, pain to palpation.  Right Foot: normal exam, no swelling, tenderness, instability; ligaments intact, full range of motion of all ankle/foot joints      Radiographs: X-ray of the left foot: no fracture, dislocation, swelling or degenerative changes noted   Assessment:   1. Ganglion cyst of left foot   2. Ulcer of great toe, left, limited to breakdown of skin (HCC)   3. Pain in left toe(s)     Plan:  Patient was evaluated and treated and all questions answered.  Likely Ganglion Cyst, Left with irritation, -XR reviewed -After local block, the lesion was aspirated with thick gelatinous fluid able to be expressed. It does appear to be a ganglion cyst. Given the degree of skin irritation I do think she'd benefit from excision. -Patient has failed all conservative therapy and wishes to proceed with surgical intervention. All risks, benefits, and alternatives discussed with patient. No guarantees given. Consent reviewed and signed by patient. -Planned procedures: excision of ganglion cyst left hallux with excision of skin lesion.   Return for post-op care.

## 2019-08-22 ENCOUNTER — Other Ambulatory Visit: Payer: Self-pay | Admitting: Podiatry

## 2019-08-22 DIAGNOSIS — M67472 Ganglion, left ankle and foot: Secondary | ICD-10-CM

## 2019-09-07 ENCOUNTER — Other Ambulatory Visit: Payer: Self-pay | Admitting: Podiatry

## 2019-09-07 DIAGNOSIS — M216X2 Other acquired deformities of left foot: Secondary | ICD-10-CM

## 2019-09-07 DIAGNOSIS — D492 Neoplasm of unspecified behavior of bone, soft tissue, and skin: Secondary | ICD-10-CM | POA: Diagnosis not present

## 2019-09-07 MED ORDER — OXYCODONE-ACETAMINOPHEN 5-325 MG PO TABS: 1.0000 | ORAL_TABLET | ORAL | 0 refills | Status: AC | PRN

## 2019-09-07 MED ORDER — ONDANSETRON HCL 4 MG PO TABS: 4.0000 mg | ORAL_TABLET | Freq: Three times a day (TID) | ORAL | 0 refills | Status: AC | PRN

## 2019-09-07 MED ORDER — CEPHALEXIN 500 MG PO CAPS: 500.0000 mg | ORAL_CAPSULE | Freq: Two times a day (BID) | ORAL | 0 refills | Status: AC

## 2019-09-07 NOTE — Progress Notes (Signed)
Rx sent to pharmacy for outpatient surgery. °

## 2019-09-13 ENCOUNTER — Encounter: Payer: Self-pay | Admitting: Family Medicine

## 2019-09-13 ENCOUNTER — Other Ambulatory Visit: Payer: Self-pay

## 2019-09-13 ENCOUNTER — Ambulatory Visit: Payer: Medicaid Other | Attending: Family Medicine | Admitting: Family Medicine

## 2019-09-13 VITALS — BP 139/80 | HR 70 | Ht 65.0 in | Wt 261.0 lb

## 2019-09-13 DIAGNOSIS — E669 Obesity, unspecified: Secondary | ICD-10-CM | POA: Diagnosis not present

## 2019-09-13 DIAGNOSIS — Z7901 Long term (current) use of anticoagulants: Secondary | ICD-10-CM | POA: Diagnosis not present

## 2019-09-13 DIAGNOSIS — E7800 Pure hypercholesterolemia, unspecified: Secondary | ICD-10-CM | POA: Diagnosis not present

## 2019-09-13 DIAGNOSIS — Z79899 Other long term (current) drug therapy: Secondary | ICD-10-CM | POA: Diagnosis not present

## 2019-09-13 DIAGNOSIS — G8929 Other chronic pain: Secondary | ICD-10-CM

## 2019-09-13 DIAGNOSIS — H538 Other visual disturbances: Secondary | ICD-10-CM | POA: Insufficient documentation

## 2019-09-13 DIAGNOSIS — I10 Essential (primary) hypertension: Secondary | ICD-10-CM | POA: Insufficient documentation

## 2019-09-13 DIAGNOSIS — I252 Old myocardial infarction: Secondary | ICD-10-CM | POA: Diagnosis not present

## 2019-09-13 DIAGNOSIS — M7989 Other specified soft tissue disorders: Secondary | ICD-10-CM | POA: Insufficient documentation

## 2019-09-13 DIAGNOSIS — Z1159 Encounter for screening for other viral diseases: Secondary | ICD-10-CM | POA: Insufficient documentation

## 2019-09-13 DIAGNOSIS — Z6841 Body Mass Index (BMI) 40.0 and over, adult: Secondary | ICD-10-CM

## 2019-09-13 DIAGNOSIS — M797 Fibromyalgia: Secondary | ICD-10-CM | POA: Insufficient documentation

## 2019-09-13 NOTE — Progress Notes (Signed)
Patient would like a physical.

## 2019-09-13 NOTE — Progress Notes (Signed)
Subjective:  Patient ID: Crystal Lowe, female    DOB: 12/28/1960  Age: 59 y.o. MRN: [id number]  CC: New Patient (Initial Visit)   HPI Daliana Allice Garro is a 59 year old female with fibromyalgia Hypertension and is on Amlodipine, HCTZ, Losartan for her hypertension previously prescribed by her PCP. Currenty on Gabapentin for Fibromyalgia. She is here to establish care but does have her chronic medications which were refilled by her previous PCP.  She is s/p excision of ganglion cyst of left foot and is currently in a cam boot. She would like to have a physical and would like to check to see if she has got Lupus due to chronic pain. Endorses swelling of joints of the hands and her legs and she sometimes has her hair fall out. Endorses blurry vision but denies presence of a rash. She has no chest pain or dyspnea.  Past Medical History:  Diagnosis Date  . High cholesterol   . Hypertension   . MI (myocardial infarction) [facility])     Past Surgical History:  Procedure Laterality Date  . ABDOMINAL HYSTERECTOMY    . CARDIAC SURGERY      Family History  Problem Relation Age of Onset  . Hypertension Mother   . Hypertension Father   . Diabetes Father   . Asthma Father   . Kidney failure Father     Allergies  Allergen Reactions  . Aspirin Swelling    Outpatient Medications Prior to Visit  Medication Sig Dispense Refill  . acetaminophen (TYLENOL) 325 MG tablet Take 650 mg by mouth every 6 (six) hours as needed.    Marland Kitchen amLODipine (NORVASC) 5 MG tablet Take 5 mg by mouth daily as needed (high blood pressure).    . cephALEXin (KEFLEX) 500 MG capsule Take 1 capsule (500 mg total) by mouth 2 (two) times daily. 14 capsule 0  . doxycycline (VIBRAMYCIN) 100 MG capsule Take 1 capsule (100 mg total) by mouth 2 (two) times daily. 20 capsule 0  . gabapentin (NEURONTIN) 100 MG capsule Take 400 mg by mouth at bedtime.     . hydrochlorothiazide (HYDRODIURIL) 25 MG tablet Take 25 mg by  mouth daily.    Marland Kitchen losartan (COZAAR) 50 MG tablet Take 100 mg by mouth daily.    . meclizine (ANTIVERT) 25 MG tablet Take 1 tablet (25 mg total) by mouth 3 (three) times daily as needed for dizziness. 30 tablet 0  . metoprolol succinate (TOPROL-XL) 50 MG 24 hr tablet Take 100 mg by mouth daily. Take with or immediately following a meal.    . ondansetron (ZOFRAN) 4 MG tablet Take 1 tablet (4 mg total) by mouth every 8 (eight) hours as needed for nausea or vomiting. 20 tablet 0  . oxyCODONE-acetaminophen (PERCOCET) 5-325 MG tablet Take 1 tablet by mouth every 4 (four) hours as needed for severe pain. 20 tablet 0  . traZODone (DESYREL) 50 MG tablet Take 50 mg by mouth at bedtime.     No facility-administered medications prior to visit.     ROS Review of Systems  Constitutional: Negative for activity change, appetite change and fatigue.  HENT: Negative for congestion, sinus pressure and sore throat.   Eyes: Negative for visual disturbance.  Respiratory: Negative for cough, chest tightness, shortness of breath and wheezing.   Cardiovascular: Negative for chest pain and palpitations.  Gastrointestinal: Negative for abdominal distention, abdominal pain and constipation.  Endocrine: Negative for polydipsia.  Genitourinary: Negative for dysuria and frequency.  Musculoskeletal:  See HPI  Skin: Negative for rash.  Neurological: Negative for tremors, light-headedness and numbness.  Hematological: Does not bruise/bleed easily.  Psychiatric/Behavioral: Negative for agitation and behavioral problems.    Objective:  BP 139/80   Pulse 70   Ht '5\' 5"'$  (1.651 m)   Wt 261 lb (118.4 kg)   SpO2 97%   BMI 43.43 kg/m   BP/Weight 09/13/2019 08/15/2019 0/62/6948  Systolic BP 546 270 350  Diastolic BP 80 85 91  Wt. (Lbs) 261 - 246  BMI 43.43 - 40.94      Physical Exam Constitutional:      Appearance: She is well-developed. She is obese.  Neck:     Vascular: No JVD.  Cardiovascular:      Rate and Rhythm: Normal rate.     Heart sounds: Normal heart sounds. No murmur.  Pulmonary:     Effort: Pulmonary effort is normal.     Breath sounds: Normal breath sounds. No wheezing or rales.  Chest:     Chest wall: No tenderness.  Abdominal:     General: Bowel sounds are normal. There is no distension.     Palpations: Abdomen is soft. There is no mass.     Tenderness: There is no abdominal tenderness.  Musculoskeletal:        General: Normal range of motion.     Right lower leg: No edema.     Left lower leg: No edema.  Neurological:     Mental Status: She is alert and oriented to person, place, and time.  Psychiatric:        Mood and Affect: Mood normal.     CMP Latest Ref Rng & Units 01/28/2018 01/27/2018 08/07/2016  Glucose 70 - 99 mg/dL - 141(H) 112(H)  BUN 6 - 20 mg/dL - 16 18  Creatinine 0.44 - 1.00 mg/dL - 0.87 0.79  Sodium 135 - 145 mmol/L - 135 139  Potassium 3.5 - 5.1 mmol/L 3.3(L) 2.4(LL) 3.3(L)  Chloride 98 - 111 mmol/L - 98 101  CO2 22 - 32 mmol/L - 25 28  Calcium 8.9 - 10.3 mg/dL - 9.4 9.7  Total Protein 6.5 - 8.1 g/dL 7.2 - -  Total Bilirubin 0.3 - 1.2 mg/dL 0.8 - -  Alkaline Phos 38 - 126 U/L 47 - -  AST 15 - 41 U/L 29 - -  ALT 0 - 44 U/L 29 - -    Lipid Panel     Component Value Date/Time   CHOL 181 01/29/2018 0520   TRIG 219 (H) 01/29/2018 0520   HDL 38 (L) 01/29/2018 0520   CHOLHDL 4.8 01/29/2018 0520   VLDL 44 (H) 01/29/2018 0520   LDLCALC 99 01/29/2018 0520    CBC    Component Value Date/Time   WBC 10.9 (H) 01/27/2018 2222   RBC 4.98 01/27/2018 2222   HGB 14.7 01/27/2018 2222   HCT 44.6 01/27/2018 2222   PLT 207 01/27/2018 2222   MCV 89.6 01/27/2018 2222   MCH 29.5 01/27/2018 2222   MCHC 33.0 01/27/2018 2222   RDW 12.8 01/27/2018 2222   LYMPHSABS 1.8 06/28/2016 1013   MONOABS 0.3 06/28/2016 1013   EOSABS 0.0 06/28/2016 1013   BASOSABS 0.0 06/28/2016 1013    Lab Results  Component Value Date   HGBA1C 5.9 (H) 01/29/2018     Assessment & Plan:   1. Essential hypertension Slightly above goal No regimen change today continue Continue amlodipine, hydrochlorothiazide, losartan Counseled on blood pressure goal of less than 130/80,  low-sodium, DASH diet, medication compliance, 150 minutes of moderate intensity exercise per week. Discussed medication compliance, adverse effects. - Lipid panel; Future - CMP14+EGFR; Future  2. Fibromyalgia We will send of labs as per patient request to evaluate for lupus but I suspect that her symptoms are from chronic pain versus fibromyalgia - Sedimentation Rate; Future - C-reactive protein; Future - ANA,IFA RA Diag Pnl w/rflx Tit/Patn; Future - Anti-DNA antibody, double-stranded; Future - Anti-Smooth Muscle Antibody, IGG; Future  3. Need for hepatitis C screening test - HCV RNA quant rflx ultra or genotyp([facility]); Future     Charlott Rakes, MD, FAAFP. [facility] and [place], [place]   09/13/2019, 1:46 PM

## 2019-09-13 NOTE — Patient Instructions (Signed)
Sndrome de Film/video editor y fibromialgia Myofascial Pain Syndrome and Fibromyalgia El sndrome de dolor miofascial y la fibromialgia son trastornos del Social research officer, government. Este dolor puede sentirse, principalmente, en los msculos.  Sndrome de dolor miofascial: ? Siempre se presenta con puntos sensibles al tacto en el msculo que causan dolor ante la compresin (puntos neurlgicos). El dolor puede aparecer y Armed forces operational officer. ? Generalmente, afecta el cuello, la parte superior de la espalda y las zonas de los hombros. El dolor suele irradiarse a los brazos y [place].  Fibromialgia: ? Cursa con dolores musculares y dolor a la palpacin que aparecen y desaparecen. ? Suele asociarse con la fatiga y los problemas del sueo. ? Cursa con puntos neurlgicos. ? Suele ser de larga duracin (crnica), pero no es potencialmente mortal. La fibromialgia y el sndrome de dolor miofascial no son lo mismo. Sin embargo, suelen presentarse juntos. Si tiene ambas afecciones, pueden intensificarse entre s. Ambas afecciones son frecuentes y pueden causar bastante dolor y [place]. Ambas afecciones pueden ser difciles de diagnosticar porque tienen sntomas que son frecuentes en muchas otras afecciones. Cules son las causas? Se desconocen las causas exactas de estas afecciones. Qu incrementa el riesgo? Es ms probable que usted sufra esta afeccin si:  Tienen antecedentes familiares de la afeccin.  Tiene ciertos factores desencadenantes, por ejemplo: ? Dolores de columna. ? Una lesin (traumatismo) u otros factores estresantes fsicos. ? Estar bajo mucho estrs. ? Afecciones mdicas como artrosis, artritis reumatoide o lupus. Cules son los signos o los sntomas? Fibromialgia El sntoma principal de la fibromialgia es el dolor ampliamente distribuido y la sensibilidad al tacto en los msculos. A veces, el dolor se describe como punzante, fulgurante o urente. Es posible  que tambin tenga:  Hormigueo o adormecimiento.  Problemas al dormir y Programmer, applications.  Problemas con la atencin y la concentracin (disfuncin cognitiva). Otros sntomas pueden incluir lo siguiente:  Problemas de intestino y vejiga.  Dolores de [place].  Problemas visuales.  Problemas con los aromas y los ruidos.  Depresin o cambios en el estado de nimo.  Perodos menstruales dolorosos (dismenorrea).  Sequedad de la piel o los ojos. Estos sntomas pueden variar con [employer]. Sndrome de [employer] Los sntomas del sndrome de dolor miofascial incluyen lo siguiente:  Bandas musculares tensas y fibrosas.  Sensaciones de incomodidad en las zonas musculares. Estas pueden incluir dolor, calambres, ardor, adormecimiento, hormigueo y debilidad.  Dificultad para mover ciertas partes del cuerpo libremente (poca amplitud de movimiento). Cmo se diagnostica? Esta afeccin puede diagnosticarse en funcin de los sntomas y los antecedentes mdicos. Adems, se le Sales promotion account executive fsico. En general:  La fibromialgia se diagnostica cuando la persona tiene Social research officer, government, fatiga y otros sntomas durante ms de tres meses y esos sntomas no se pueden atribuir a Pharmacist, hospital.  El sndrome de dolor miofascial se diagnostica cuando la persona tiene puntos neurlgicos en los msculos que son sensibles al tacto y causan dolor en otra parte del cuerpo (dolor referido). Cmo se trata? El tratamiento para estas afecciones depende del tipo de afeccin que tenga.  Para la fibromialgia: ? Analgsicos, como los antiinflamatorios no esteroideos (AINE). ? Medicamentos para tratar la depresin. ? Medicamentos para tratar las convulsiones. ? Medicamentos que [employer].  Para el sndrome de dolor miofascial: ? Analgsicos, como los antiinflamatorios no esteroideos (AINE). ? Relajacin y elongacin de los msculos. ? Inyecciones en los puntos neurlgicos. ? Tratamientos con ondas de  sonido (ultrasonido) para [employer]  msculos. El tratamiento de estas afecciones a menudo requiere la participacin de un equipo de mdicos. Estos pueden incluir lo siguiente:  El mdico de Marketing executive.  Un fisioterapeuta.  Mdicos complementarios, como masoterapeutas o acupunturistas.  Un psiquiatra para la terapia cognitivo conductual. Siga estas indicaciones en su casa: Medicamentos  Tome los medicamentos de venta libre y los recetados solamente como se lo haya indicado el mdico.  No conduzca ni use maquinaria pesada mientras toma analgsicos recetados.  Si toma analgsicos recetados, tome medidas para prevenir o tratar el estreimiento. El mdico puede recomendarle que: ? Beba suficiente lquido como para mantener la orina de color amarillo plido. ? Consuma alimentos ricos en fibra, como frutas y verduras frescas, cereales integrales y frijoles. ? Limite el consumo de alimentos ricos en grasa y azcares procesados, como los alimentos fritos o dulces. ? Tome un medicamento recetado o de venta libre para el estreimiento. Estilo de vida   Haga ejercicio como se lo haya indicado el fisioterapeuta o su mdico.  Practique tcnicas de relajacin para mantener el estrs bajo control. Tal vez deba intentar lo siguiente: ? Biorretroalimentacin. ? Formacin de imgenes visuales. ? Hipnosis. ? Relajacin muscular. ? Yoga. ? Meditacin.  Lleve un estilo de vida saludable. Esto incluye consumir una dieta saludable y dormir lo suficiente.  No consuma ningn producto que contenga nicotina o tabaco, como cigarrillos y Psychologist, sport and exercise. Si necesita ayuda para dejar de fumar, consulte al mdico. Indicaciones generales  Hable con el mdico sobre los tratamientos complementarios, como acupuntura o [place].  Considere la posibilidad de unirse a un grupo de apoyo para personas a quienes les diagnosticaron esta afeccin.  No realice actividades que le generen tensin o sobrecarga  muscular. Esto incluye hacer movimientos repetitivos y Lexicographer objetos pesados.  Concurra a todas las visitas de seguimiento como se lo haya indicado el mdico. Esto es importante. Dnde buscar ms informacin  Asociacin Nacional de Fibromialgia (National Fibromyalgia Association): www.fmaware.org  Fundacin contra la Artritis (Arthritis Foundation): www.arthritis.org  Asociacin Estadounidense del Dolor Crnico (American Chronic Pain Association): www.theacpa.org Comunquese con un mdico si:  Aparecen nuevos sntomas.  Los sntomas empeoran o el dolor es intenso.  Los [employer] causan efectos secundarios.  Tiene dificultad para dormir.  La afeccin le causa depresin o ansiedad. Resumen  El sndrome de dolor miofascial y la fibromialgia son trastornos del Social research officer, government.  El sndrome de dolor miofascial se presenta con puntos sensibles al tacto en el msculo que causan dolor ante la compresin (puntos neurlgicos). La fibromialgia tambin se presenta con dolores musculares y sensibilidad al tacto que aparecen y desaparecen, pero esta afeccin generalmente se asocia a la fatiga y alteraciones del sueo.  La fibromialgia y el sndrome de dolor miofascial no son lo mismo, pero suelen presentarse juntos, lo cual Visual merchandiser y fatiga que dificultan las actividades cotidianas.  El tratamiento para la fibromialgia incluye tomar medicamentos para Media planner los msculos y medicamentos para el dolor, la depresin o las convulsiones. El tratamiento para el sndrome de dolor miofascial incluye tomar medicamentos para Conservation officer, historic buildings, enfriar y Neurosurgeon los msculos e inyectar medicamentos en los puntos neurlgicos.  Siga las indicaciones del mdico en cuanto a tomar medicamentos y Theatre manager un estilo de vida saludable. Esta informacin no tiene Marine scientist el consejo del mdico. Asegrese de hacerle al mdico cualquier pregunta que tenga. Document Revised: 09/25/2017 Document Reviewed:  09/25/2017 Elsevier Patient Education  Republic.

## 2019-09-14 ENCOUNTER — Ambulatory Visit: Payer: Medicaid Other | Attending: Family Medicine

## 2019-09-14 DIAGNOSIS — M797 Fibromyalgia: Secondary | ICD-10-CM

## 2019-09-14 DIAGNOSIS — Z1159 Encounter for screening for other viral diseases: Secondary | ICD-10-CM

## 2019-09-14 DIAGNOSIS — I10 Essential (primary) hypertension: Secondary | ICD-10-CM

## 2019-09-15 ENCOUNTER — Encounter: Payer: Self-pay | Admitting: Podiatry

## 2019-09-16 ENCOUNTER — Telehealth: Payer: Self-pay

## 2019-09-16 ENCOUNTER — Ambulatory Visit (INDEPENDENT_AMBULATORY_CARE_PROVIDER_SITE_OTHER): Payer: Medicaid Other | Admitting: Podiatry

## 2019-09-16 ENCOUNTER — Other Ambulatory Visit: Payer: Self-pay

## 2019-09-16 VITALS — Temp 97.9°F

## 2019-09-16 DIAGNOSIS — M67472 Ganglion, left ankle and foot: Secondary | ICD-10-CM

## 2019-09-16 DIAGNOSIS — M79675 Pain in left toe(s): Secondary | ICD-10-CM

## 2019-09-16 NOTE — Progress Notes (Signed)
  Subjective:  Patient ID: Crystal Lowe, female    DOB: 02-03-1961,  MRN: [id number]  Chief Complaint  Patient presents with  . Cyst    POV#1 DOS 02.10.2021 Exc. Benign lesion 1.0cm-2.0cm or Exc. Ganglion/Tumor Rt. Pt states she has some pain at the base of her left 1st nail and has swelling. Pt reports nausea without vomiting. Denies fever/chills.    DOS: 09/07/19 Procedure: Excision of skin lesion right great toe, adjacent tissue transfer right  59 y.o. female presents with the above complaint. History confirmed with patient.   Objective:  Physical Exam: tenderness at the surgical site, local edema noted and calf supple, nontender. Incision: healing well, no significant drainage, no dehiscence, no significant erythema  Assessment:   1. Ganglion cyst of left foot   2. Pain in left toe(s)     Plan:  Patient was evaluated and treated and all questions answered.  Post-operative State -Dressing applied consisting of sterile gauze, kerlix and coban -WBAT in Surgical shoe  -F/u in 1 week for possible suture removal.  No follow-ups on file.

## 2019-09-16 NOTE — Telephone Encounter (Signed)
Patient was called and a voicemail was left informing patient to return phone call for lab results. 

## 2019-09-16 NOTE — Telephone Encounter (Signed)
-----   Message from Charlott Rakes, MD sent at 09/16/2019 12:04 PM EST ----- Please inform her tests so far for lupus have been negative but some are still pending.  Total cholesterol is normal but triglycerides which is a type of cholesterol is slightly elevated.  I would recommend intake of omega-3 fish oil capsules and adherence to a low-cholesterol diet

## 2019-09-22 ENCOUNTER — Ambulatory Visit: Payer: Medicaid Other | Admitting: Podiatry

## 2019-09-22 ENCOUNTER — Other Ambulatory Visit: Payer: Self-pay

## 2019-09-22 DIAGNOSIS — L97521 Non-pressure chronic ulcer of other part of left foot limited to breakdown of skin: Secondary | ICD-10-CM

## 2019-09-22 DIAGNOSIS — M67472 Ganglion, left ankle and foot: Secondary | ICD-10-CM

## 2019-09-22 DIAGNOSIS — M79675 Pain in left toe(s): Secondary | ICD-10-CM

## 2019-09-22 LAB — HCV RNA QUANT RFLX ULTRA OR GENOTYP: HCV Quant Baseline: NOT DETECTED IU/mL

## 2019-09-22 LAB — LIPID PANEL
Chol/HDL Ratio: 4.4 ratio (ref 0.0–4.4)
Cholesterol, Total: 196 mg/dL (ref 100–199)
HDL: 45 mg/dL (ref >39)
LDL Chol Calc (NIH): 107 mg/dL — ABNORMAL HIGH (ref 0–99)
Triglycerides: 256 mg/dL — ABNORMAL HIGH (ref 0–149)
VLDL Cholesterol Cal: 44 mg/dL — ABNORMAL HIGH (ref 5–40)

## 2019-09-22 LAB — ANA,IFA RA DIAG PNL W/RFLX TIT/PATN
ANA Titer 1: NEGATIVE
Cyclic Citrullin Peptide Ab: 5 units (ref 0–19)
Rhuematoid fact SerPl-aCnc: <10 IU/mL (ref 0.0–13.9)

## 2019-09-22 LAB — CMP14+EGFR
ALT: 50 IU/L — ABNORMAL HIGH (ref 0–32)
AST: 46 IU/L — ABNORMAL HIGH (ref 0–40)
Albumin/Globulin Ratio: 1.6 (ref 1.2–2.2)
Albumin: 4.5 g/dL (ref 3.8–4.9)
Alkaline Phosphatase: 86 IU/L (ref 39–117)
BUN/Creatinine Ratio: 17 (ref 9–23)
BUN: 12 mg/dL (ref 6–24)
Bilirubin Total: 0.4 mg/dL (ref 0.0–1.2)
CO2: 28 mmol/L (ref 20–29)
Calcium: 9.9 mg/dL (ref 8.7–10.2)
Chloride: 103 mmol/L (ref 96–106)
Creatinine, Ser: 0.7 mg/dL (ref 0.57–1.00)
GFR calc Af Amer: 112 mL/min/{1.73_m2} (ref >59)
GFR calc non Af Amer: 97 mL/min/{1.73_m2} (ref >59)
Globulin, Total: 2.8 g/dL (ref 1.5–4.5)
Glucose: 113 mg/dL — ABNORMAL HIGH (ref 65–99)
Potassium: 4.7 mmol/L (ref 3.5–5.2)
Sodium: 143 mmol/L (ref 134–144)
Total Protein: 7.3 g/dL (ref 6.0–8.5)

## 2019-09-22 LAB — SEDIMENTATION RATE: Sed Rate: 6 mm/hr (ref 0–40)

## 2019-09-22 LAB — C-REACTIVE PROTEIN: CRP: 3 mg/L (ref 0–10)

## 2019-09-22 LAB — ANTI-DNA ANTIBODY, DOUBLE-STRANDED: dsDNA Ab: <1 IU/mL (ref 0–9)

## 2019-09-22 LAB — ANTI-SMOOTH MUSCLE ANTIBODY, IGG: Smooth Muscle Ab: 11 Units (ref 0–19)

## 2019-09-22 NOTE — Progress Notes (Signed)
  Subjective:  Patient ID: Crystal Lowe, female    DOB: 07/13/1961,  MRN: [id number]  Chief Complaint  Patient presents with  . Routine Post Op    POV#2 DOS 02.10.2021 EXC. BENIGN LESION 1.0CM-2.0CM OR EXC. GANGLION/TUMOR RT. Pt states healing well without much pain. Pt denies fever/nausea/vomiting/chills.    DOS: 09/07/19 Procedure: Excision of skin lesion right great toe, adjacent tissue transfer right  59 y.o. female presents with the above complaint. History confirmed with patient.   Objective:  Physical Exam: tenderness at the surgical site, local edema noted and calf supple, nontender. Incision: healing well, no significant drainage, no dehiscence, no significant erythema  Assessment:   1. Ganglion cyst of left foot   2. Pain in left toe(s)   3. Ulcer of great toe, left, limited to breakdown of skin [facility])     Plan:  Patient was evaluated and treated and all questions answered.  Post-operative State -WBAT in Surgical shoe. Transition to normal shoe in 1 week -Reviewed path with patient - suggestive of ganglion -Sutures removed  No follow-ups on file.

## 2019-09-23 ENCOUNTER — Telehealth: Payer: Self-pay

## 2019-09-23 NOTE — Telephone Encounter (Signed)
-----   Message from Charlott Rakes, MD sent at 09/16/2019 12:04 PM EST ----- Please inform her tests so far for lupus have been negative but some are still pending.  Total cholesterol is normal but triglycerides which is a type of cholesterol is slightly elevated.  I would recommend intake of omega-3 fish oil capsules and adherence to a low-cholesterol diet

## 2019-09-23 NOTE — Telephone Encounter (Signed)
Patient was called and a voicemail was left informing patient to return phone call for lab results. 

## 2019-09-30 ENCOUNTER — Telehealth: Payer: Self-pay

## 2019-09-30 NOTE — Telephone Encounter (Signed)
Patient name and DOB has been verified Patient was informed of lab results. Patient had no questions.  

## 2019-09-30 NOTE — Telephone Encounter (Signed)
-----   Message from Charlott Rakes, MD sent at 09/16/2019 12:04 PM EST ----- Please inform her tests so far for lupus have been negative but some are still pending.  Total cholesterol is normal but triglycerides which is a type of cholesterol is slightly elevated.  I would recommend intake of omega-3 fish oil capsules and adherence to a low-cholesterol diet

## 2019-10-06 ENCOUNTER — Other Ambulatory Visit: Payer: Self-pay

## 2019-10-06 ENCOUNTER — Ambulatory Visit (INDEPENDENT_AMBULATORY_CARE_PROVIDER_SITE_OTHER): Payer: Medicaid Other | Admitting: Podiatry

## 2019-10-06 DIAGNOSIS — Z9889 Other specified postprocedural states: Secondary | ICD-10-CM

## 2019-10-06 DIAGNOSIS — M67472 Ganglion, left ankle and foot: Secondary | ICD-10-CM

## 2019-10-06 NOTE — Progress Notes (Signed)
  Subjective:  Patient ID: Crystal Lowe, female    DOB: [birth year],  MRN: [id number]  No chief complaint on file.   DOS: 09/07/19 Procedure: Excision of skin lesion right great toe, adjacent tissue transfer right  59 y.o. female presents for f/u of the above surgery. States the toe feels well and she only has occasional pain on the other side of the toe not where she had surgery. Wearing normal shoe instead of surgical shoe today.  Objective:  Physical Exam: no pain at surgical site Incision: well healed, no recurrence noted.  Assessment:   No diagnosis found.  Plan:  Patient was evaluated and treated and all questions answered.  Post-operative State -Doing well, not having any pain at surgical site. -Transition to normal shoegear. F/u PRN should issues occur  No follow-ups on file.

## 2019-10-12 ENCOUNTER — Ambulatory Visit: Payer: Medicaid Other | Admitting: Family Medicine

## 2019-11-15 ENCOUNTER — Other Ambulatory Visit: Payer: Self-pay | Admitting: Family Medicine

## 2019-11-15 NOTE — Telephone Encounter (Signed)
Patient called requesting a refill on the following medications:    amLODipine (NORVASC) 5 MG tablet doxycycline (VIBRAMYCIN) 100 MG capsule gabapentin (NEURONTIN) 100 MG capsule  hydrochlorothiazide (HYDRODIURIL) 25 MG tablet losartan (COZAAR) 50 MG tablet  meclizine (ANTIVERT) 25 MG tablet metoprolol succinate (TOPROL-XL) 50 MG 24 hr tablet traZODone (DESYREL) 50 MG tablet   [place] ([facility] [place] [facility] [place]  [id number], [place] ([place]) [place] [address]

## 2019-11-16 MED ORDER — HYDROCHLOROTHIAZIDE 25 MG PO TABS: 25.0000 mg | ORAL_TABLET | Freq: Every day | ORAL | 0 refills | Status: AC

## 2019-11-16 MED ORDER — TRAZODONE HCL 50 MG PO TABS: 50.0000 mg | ORAL_TABLET | Freq: Every day | ORAL | 0 refills | Status: AC

## 2019-11-16 MED ORDER — MECLIZINE HCL 25 MG PO TABS: 25.0000 mg | ORAL_TABLET | Freq: Three times a day (TID) | ORAL | 0 refills | Status: AC | PRN

## 2019-11-16 MED ORDER — METOPROLOL SUCCINATE ER 50 MG PO TB24: 100.0000 mg | ORAL_TABLET | Freq: Every day | ORAL | 0 refills | Status: AC

## 2019-11-16 MED ORDER — AMLODIPINE BESYLATE 5 MG PO TABS: 5.0000 mg | ORAL_TABLET | Freq: Every day | ORAL | 0 refills | Status: AC

## 2019-11-16 MED ORDER — LOSARTAN POTASSIUM 50 MG PO TABS: 100.0000 mg | ORAL_TABLET | Freq: Every day | ORAL | 0 refills | Status: AC

## 2019-11-16 NOTE — Telephone Encounter (Signed)
Pt was placed on doxycycline in Jan, 2021. Will need to send this to PCP for review. Will also send gabapentin for review. Other medications sent.

## 2019-11-21 MED ORDER — GABAPENTIN 100 MG PO CAPS: 400.0000 mg | ORAL_CAPSULE | Freq: Every day | ORAL | 2 refills | Status: AC

## 2019-11-23 ENCOUNTER — Ambulatory Visit: Payer: Medicaid Other | Admitting: Family Medicine

## 2019-11-24 ENCOUNTER — Other Ambulatory Visit: Payer: Self-pay | Admitting: Family Medicine

## 2020-09-18 IMAGING — DX DG KNEE COMPLETE 4+V*R*
4 series · 4 of 4 positions shown · non-contrast
Comparison: None.

CLINICAL DATA: Right knee pain, history of multiple falls, initial
encounter

EXAM:
RIGHT KNEE - COMPLETE 4+ VIEW

[dg knee complete 4 views right (1 of 4)]
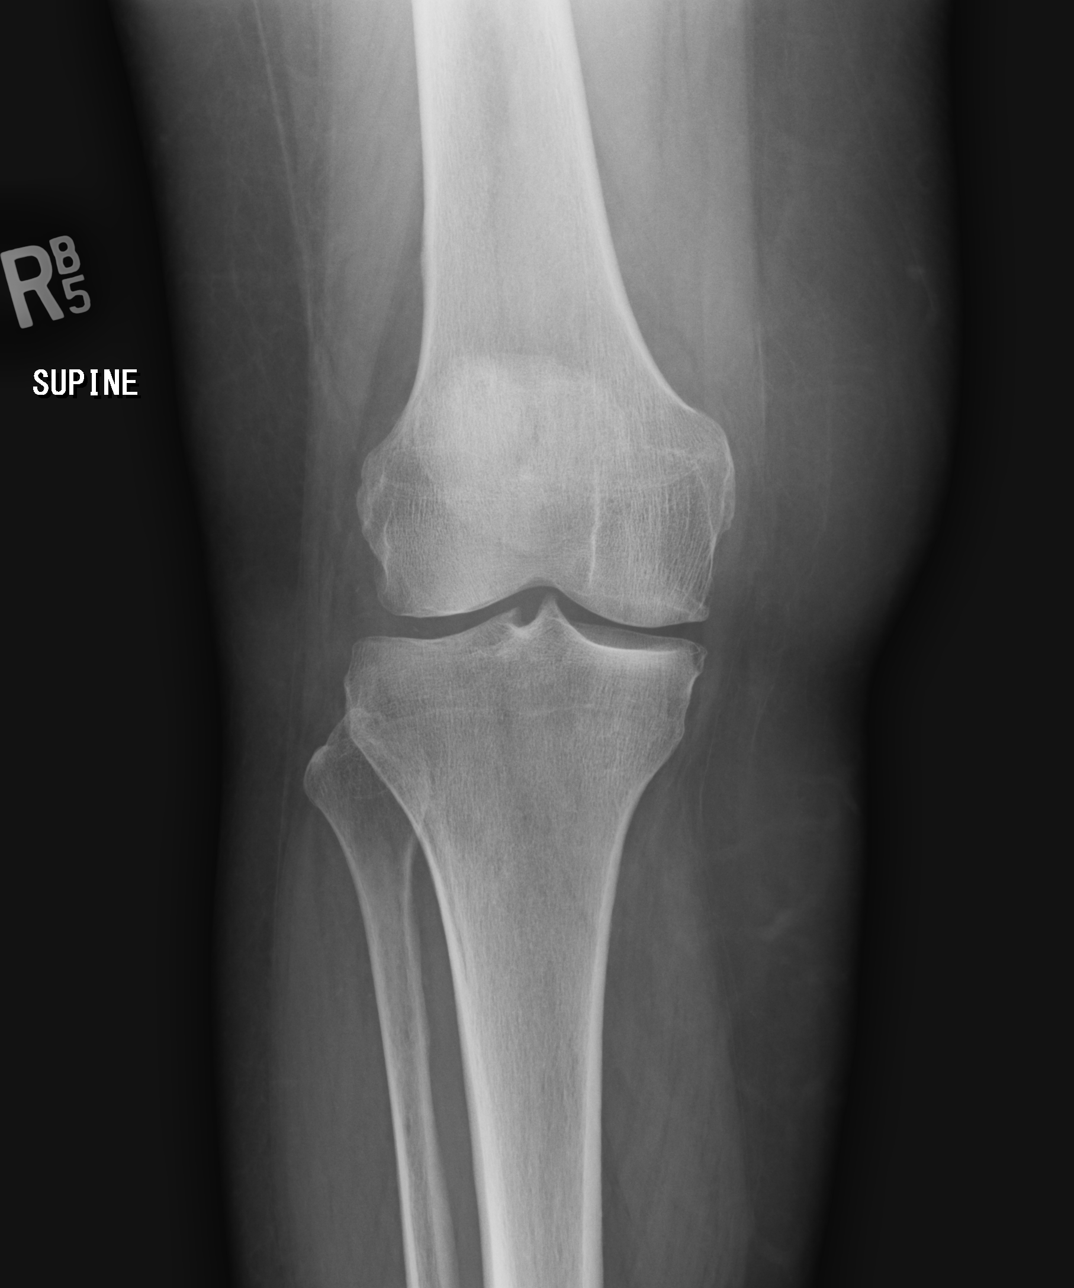

[dg knee complete 4 views right (2 of 4)]
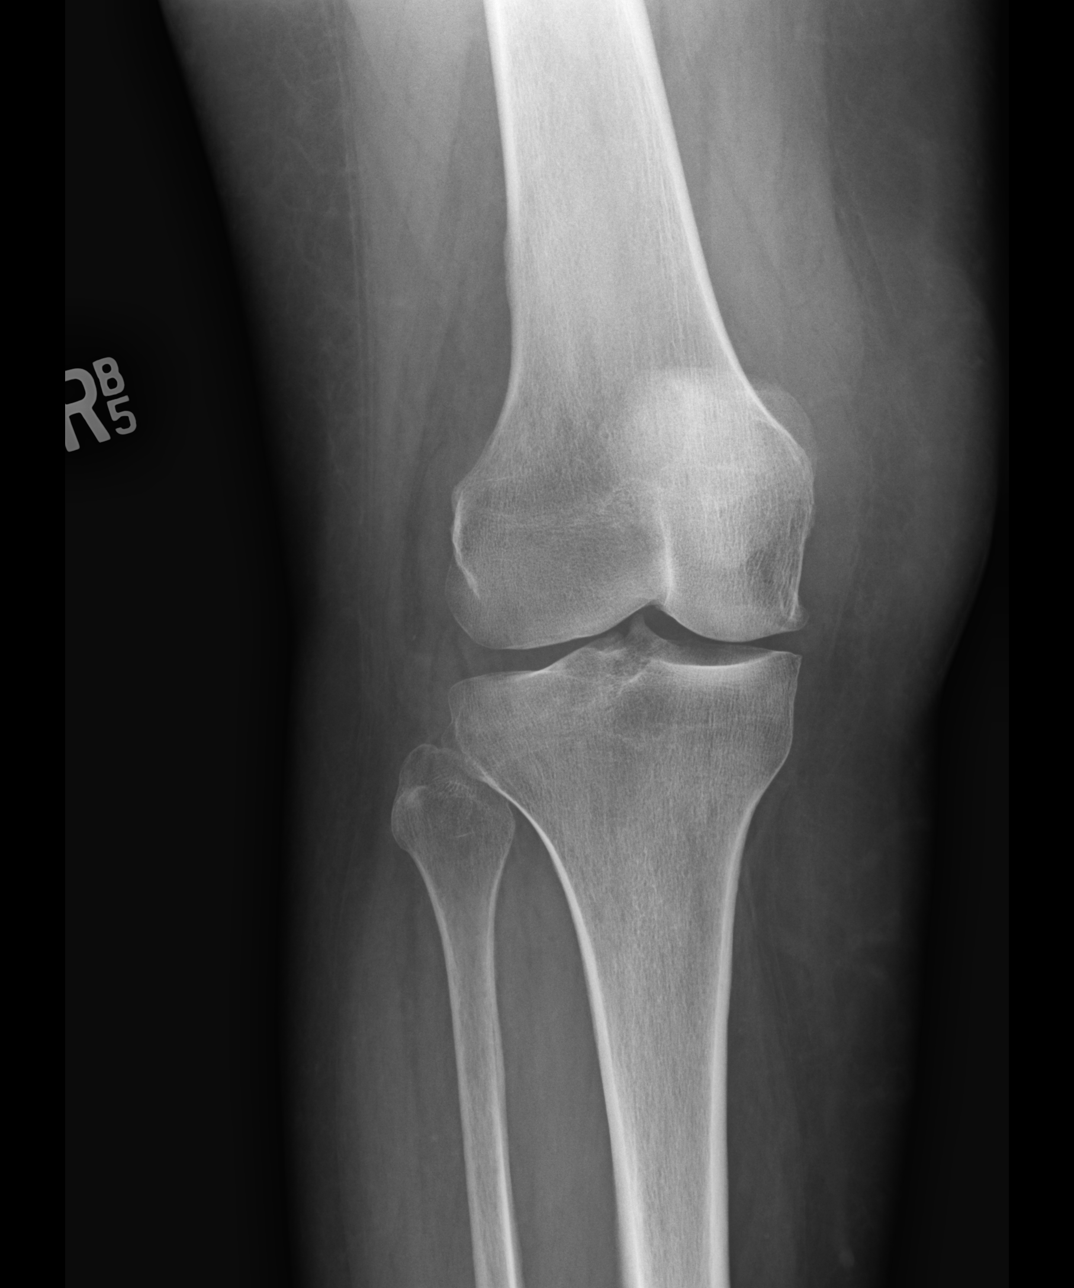

[dg knee complete 4 views right (3 of 4)]
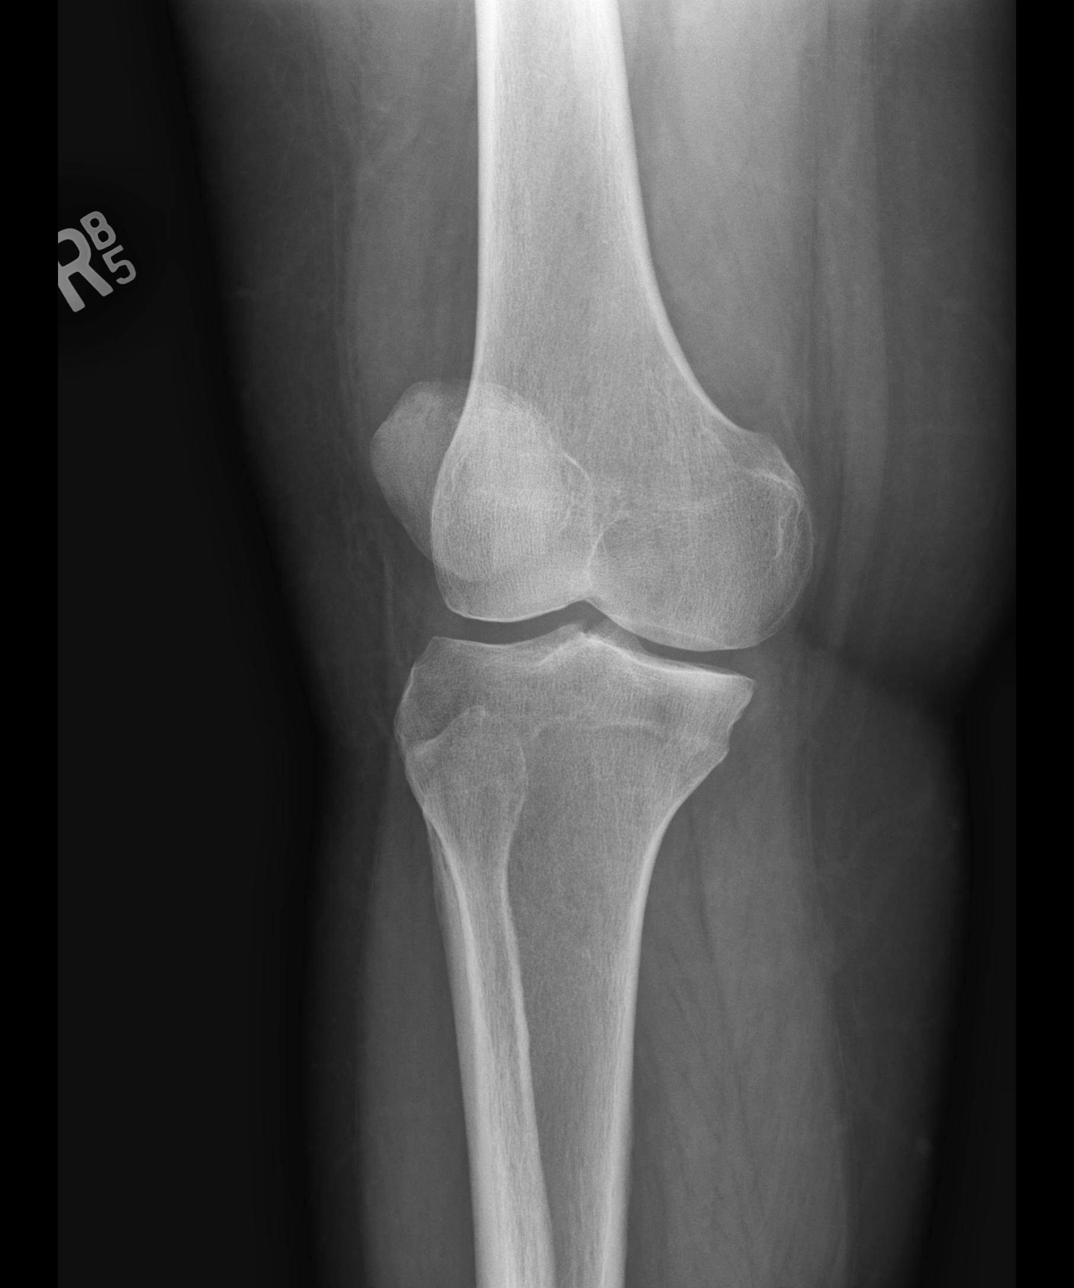

[dg knee complete 4 views right (4 of 4)]
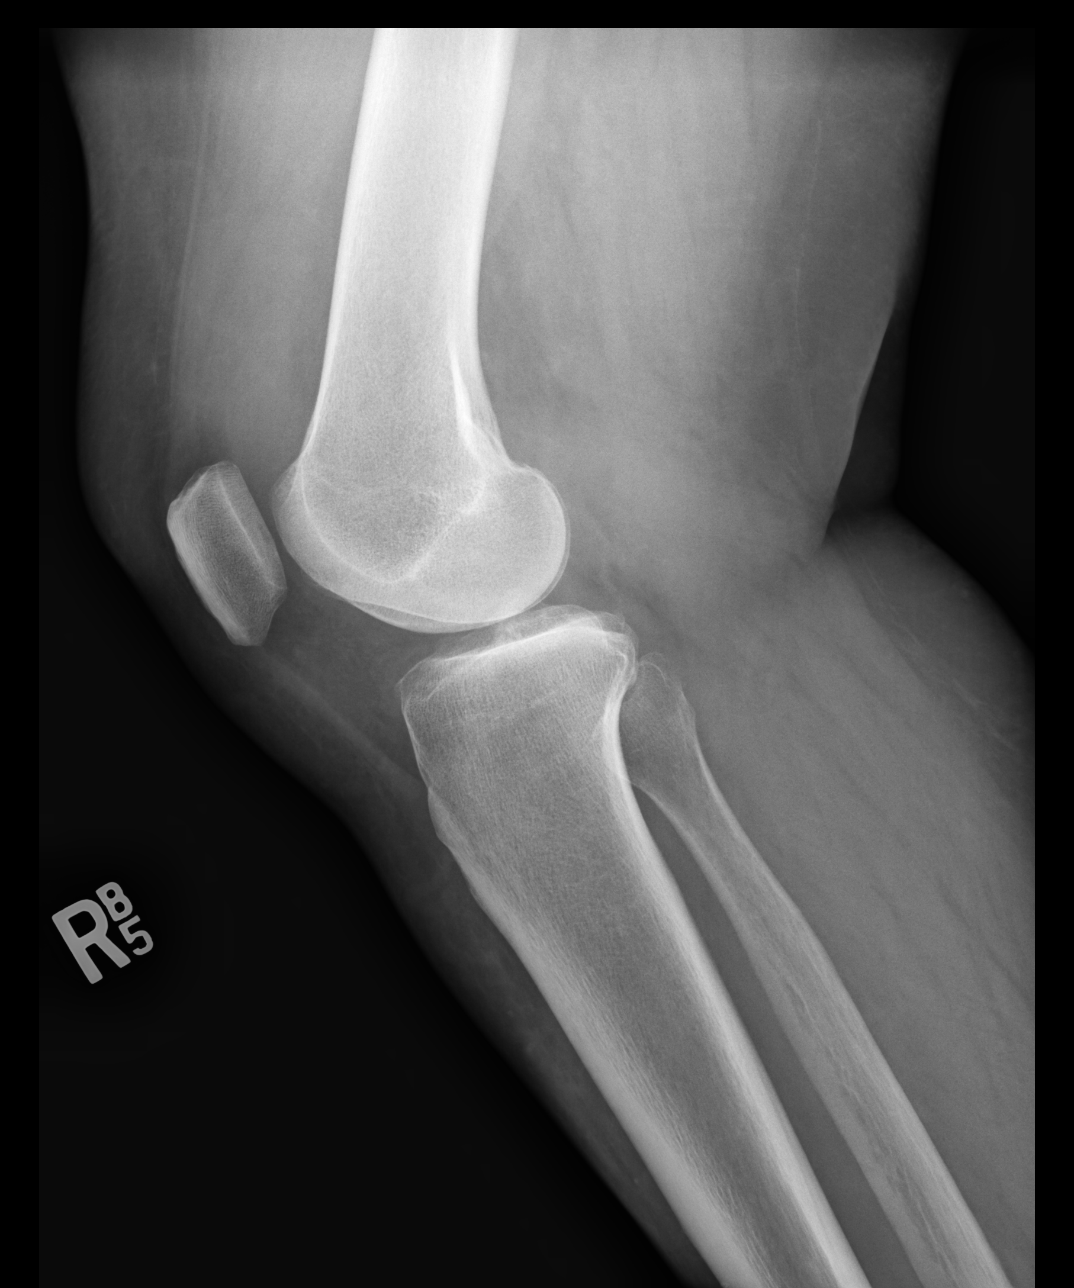

[4 of 4 positions shown; findings below may reference images not displayed]

FINDINGS: Mild degenerative changes are noted in the medial joint space. Mild
joint effusion is noted. No acute fracture or dislocation is seen.
IMPRESSION: No acute abnormality noted. Mild degenerative change with joint
effusion.
